# Patient Record
Sex: Female | Born: 1980 | Race: White | Hispanic: No | Marital: Married | Smoking: Former smoker
Health system: Southern US, Community
[De-identification: ages and names within clinical notes are randomized; demographics above are authoritative.]

## PROBLEM LIST (undated history)

## (undated) DIAGNOSIS — Q950 Balanced translocation and insertion in normal individual: Secondary | ICD-10-CM

## (undated) DIAGNOSIS — J45909 Unspecified asthma, uncomplicated: Secondary | ICD-10-CM

## (undated) HISTORY — PX: WISDOM TOOTH EXTRACTION: SHX21

## (undated) HISTORY — DX: Balanced translocation and insertion in normal individual: Q95.0

## (undated) HISTORY — DX: Unspecified asthma, uncomplicated: J45.909

---

## 2014-07-07 HISTORY — PX: DILATION AND CURETTAGE OF UTERUS: SHX78

## 2014-09-13 ENCOUNTER — Ambulatory Visit: Payer: BLUE CROSS/BLUE SHIELD | Admitting: General Practice

## 2014-09-13 DIAGNOSIS — Z3490 Encounter for supervision of normal pregnancy, unspecified, unspecified trimester: Secondary | ICD-10-CM

## 2014-09-13 LAB — POCT PREGNANCY, URINE: PREG TEST UR: POSITIVE — AB

## 2014-09-13 NOTE — Progress Notes (Signed)
Patient here for pregnancy test, UPT positive. Reports first positive home test on 2/24. LMP 08/09/14 giving EDD 05/16/15. Would like to start prenatal care. No ultrasound needed at this time given certain LMP. Will draw new OB labs today. Patient to return for new OB visit/1 hr gtt

## 2014-09-14 LAB — PRENATAL PROFILE (SOLSTAS)
Antibody Screen: NEGATIVE
Basophils Absolute: 0.1 10*3/uL (ref 0.0–0.1)
Basophils Relative: 3 % — ABNORMAL HIGH (ref 0–1)
Eosinophils Absolute: 0.2 10*3/uL (ref 0.0–0.7)
Eosinophils Relative: 7 % — ABNORMAL HIGH (ref 0–5)
HEMATOCRIT: 40.6 % (ref 36.0–46.0)
HEMOGLOBIN: 13.9 g/dL (ref 12.0–15.0)
HEP B S AG: NEGATIVE
HIV: NONREACTIVE
LYMPHS PCT: 63 % — AB (ref 12–46)
Lymphs Abs: 1.4 10*3/uL (ref 0.7–4.0)
MCH: 32.9 pg (ref 26.0–34.0)
MCHC: 34.2 g/dL (ref 30.0–36.0)
MCV: 96 fL (ref 78.0–100.0)
MONOS PCT: 20 % — AB (ref 3–12)
MPV: 9.9 fL (ref 8.6–12.4)
Monocytes Absolute: 0.5 10*3/uL (ref 0.1–1.0)
Neutro Abs: 0.2 10*3/uL — ABNORMAL LOW (ref 1.7–7.7)
Neutrophils Relative %: 7 % — ABNORMAL LOW (ref 43–77)
PLATELETS: 241 10*3/uL (ref 150–400)
RBC: 4.23 MIL/uL (ref 3.87–5.11)
RDW: 13.5 % (ref 11.5–15.5)
Rh Type: POSITIVE
Rubella: 7.15 Index — ABNORMAL HIGH (ref <0.90)
WBC: 2.3 10*3/uL — ABNORMAL LOW (ref 4.0–10.5)

## 2014-09-14 LAB — CYSTIC FIBROSIS DIAGNOSTIC STUDY

## 2014-09-14 LAB — CULTURE, OB URINE
COLONY COUNT: NO GROWTH
Organism ID, Bacteria: NO GROWTH

## 2014-09-15 LAB — PRESCRIPTION MONITORING PROFILE (19 PANEL)
AMPHETAMINE/METH: NEGATIVE ng/mL
BENZODIAZEPINE SCREEN, URINE: NEGATIVE ng/mL
Barbiturate Screen, Urine: NEGATIVE ng/mL
Buprenorphine, Urine: NEGATIVE ng/mL
Cannabinoid Scrn, Ur: NEGATIVE ng/mL
Carisoprodol, Urine: NEGATIVE ng/mL
Cocaine Metabolites: NEGATIVE ng/mL
Creatinine, Urine: 161.95 mg/dL (ref >20.0)
FENTANYL URINE: NEGATIVE ng/mL
MDMA URINE: NEGATIVE ng/mL
METHADONE SCREEN, URINE: NEGATIVE ng/mL
Meperidine, Ur: NEGATIVE ng/mL
Methaqualone: NEGATIVE ng/mL
NITRITES URINE, INITIAL: NEGATIVE ug/mL
Opiate Screen, Urine: NEGATIVE ng/mL
Oxycodone Screen, Ur: NEGATIVE ng/mL
PH URINE, INITIAL: 7 pH (ref 4.5–8.9)
PHENCYCLIDINE, UR: NEGATIVE ng/mL
Propoxyphene: NEGATIVE ng/mL
TAPENTADOLUR: NEGATIVE ng/mL
TRAMADOL UR: NEGATIVE ng/mL
Zolpidem, Urine: NEGATIVE ng/mL

## 2014-10-17 ENCOUNTER — Ambulatory Visit (HOSPITAL_COMMUNITY)
Admission: RE | Admit: 2014-10-17 | Discharge: 2014-10-17 | Disposition: A | Discharge status: Home / Self Care | Payer: BLUE CROSS/BLUE SHIELD | Source: Ambulatory Visit | Attending: Obstetrics and Gynecology | Admitting: Obstetrics and Gynecology

## 2014-10-17 ENCOUNTER — Encounter: Payer: Self-pay | Admitting: Obstetrics and Gynecology

## 2014-10-17 ENCOUNTER — Ambulatory Visit (INDEPENDENT_AMBULATORY_CARE_PROVIDER_SITE_OTHER): Payer: BLUE CROSS/BLUE SHIELD | Admitting: Obstetrics and Gynecology

## 2014-10-17 DIAGNOSIS — Z3A08 8 weeks gestation of pregnancy: Secondary | ICD-10-CM | POA: Diagnosis not present

## 2014-10-17 DIAGNOSIS — O3680X1 Pregnancy with inconclusive fetal viability, fetus 1: Secondary | ICD-10-CM

## 2014-10-17 DIAGNOSIS — Z3491 Encounter for supervision of normal pregnancy, unspecified, first trimester: Secondary | ICD-10-CM

## 2014-10-17 DIAGNOSIS — Z36 Encounter for antenatal screening of mother: Secondary | ICD-10-CM | POA: Insufficient documentation

## 2014-10-17 DIAGNOSIS — Z3481 Encounter for supervision of other normal pregnancy, first trimester: Secondary | ICD-10-CM

## 2014-10-17 DIAGNOSIS — O3481 Maternal care for other abnormalities of pelvic organs, first trimester: Secondary | ICD-10-CM | POA: Diagnosis not present

## 2014-10-17 DIAGNOSIS — O209 Hemorrhage in early pregnancy, unspecified: Secondary | ICD-10-CM | POA: Diagnosis not present

## 2014-10-17 DIAGNOSIS — O021 Missed abortion: Secondary | ICD-10-CM | POA: Diagnosis not present

## 2014-10-17 DIAGNOSIS — N831 Corpus luteum cyst: Secondary | ICD-10-CM | POA: Insufficient documentation

## 2014-10-17 LAB — POCT URINALYSIS DIP (DEVICE)
BILIRUBIN URINE: NEGATIVE
Glucose, UA: NEGATIVE mg/dL
Hgb urine dipstick: NEGATIVE
KETONES UR: NEGATIVE mg/dL
LEUKOCYTES UA: NEGATIVE
Nitrite: NEGATIVE
PH: 7 (ref 5.0–8.0)
Protein, ur: NEGATIVE mg/dL
SPECIFIC GRAVITY, URINE: 1.02 (ref 1.005–1.030)
Urobilinogen, UA: 1 mg/dL (ref 0.0–1.0)

## 2014-10-17 NOTE — Progress Notes (Signed)
Bedside US for viability - single IUP, unable to confirm FHR with abdominal scan.  Pt taken to [place] dept for transvag exam and will return to discuss results.

## 2014-10-17 NOTE — Patient Instructions (Addendum)
Incomplete Miscarriage A miscarriage is the sudden loss of an unborn baby (fetus) before the 20th week of pregnancy. In an incomplete miscarriage, parts of the fetus or placenta (afterbirth) remain in the body.  Having a miscarriage can be an emotional experience. Talk with your health care provider about any questions you may have about miscarrying, the grieving process, and your future pregnancy plans. CAUSES   Problems with the fetal chromosomes that make it impossible for the baby to develop normally. Problems with the baby's genes or chromosomes are most often the result of errors that occur by chance as the embryo divides and grows. The problems are not inherited from the parents.  Infection of the cervix or uterus.  Hormone problems.  Problems with the cervix, such as having an incompetent cervix. This is when the tissue in the cervix is not strong enough to hold the pregnancy.  Problems with the uterus, such as an abnormally shaped uterus, uterine fibroids, or congenital abnormalities.  Certain medical conditions.  Smoking, drinking alcohol, or taking illegal drugs.  Trauma. SYMPTOMS   Vaginal bleeding or spotting, with or without cramps or pain.  Pain or cramping in the abdomen or lower back.  Passing fluid, tissue, or blood clots from the vagina. DIAGNOSIS  Your health care provider will perform a physical exam. You may also have an ultrasound to confirm the miscarriage. Blood or urine tests may also be ordered. TREATMENT   Usually, a dilation and curettage (D&C) procedure is performed. During a D&C procedure, the cervix is widened (dilated) and any remaining fetal or placental tissue is gently removed from the uterus.  Antibiotic medicines are prescribed if there is an infection. Other medicines may be given to reduce the size of the uterus (contract) if there is a lot of bleeding.  If you have Rh negative blood and your baby was Rh positive, you will need a Rho (D)  immune globulin shot. This shot will protect any future baby from having Rh blood problems in future pregnancies.  You may be confined to bed rest. This means you should stay in bed and only get up to use the bathroom. HOME CARE INSTRUCTIONS   Rest as directed by your health care provider.  Restrict activity as directed by your health care provider. You may be allowed to continue light activity if curettage was not done but you require further treatment.  Keep track of the number of pads you use each day. Keep track of how soaked (saturated) they are. Record this information.  Do not  use tampons.  Do not douche or have sexual intercourse until approved by your health care provider.  Keep all follow-up appointments for reevaluation and continuing management.  Only take over-the-counter or prescription medicines for pain, fever, or discomfort as directed by your health care provider.  Take antibiotic medicine as directed by your health care provider. Make sure you finish it even if you start to feel better. SEEK IMMEDIATE MEDICAL CARE IF:   You experience severe cramps in your stomach, back, or abdomen.  You have an unexplained temperature (make sure to record these temperatures).  You pass large clots or tissue (save these for your health care provider to inspect).  Your bleeding increases.  You become light-headed, weak, or have fainting episodes. MAKE SURE YOU:   Understand these instructions.  Will watch your condition.  Will get help right away if you are not doing well or get worse. Document Released: 06/23/2005 Document Revised: 11/07/2013 Document Reviewed:   01/20/2013 Exit[employer] Patient Information 2015 [employer]. This information is not intended to replace advice given to you by your health care provider. Make sure you discuss any questions you have with your health care provider.  

## 2014-10-17 NOTE — Progress Notes (Signed)
SUBJECTIVE: 34 yo G1 at [redacted]w[redacted]d menstrual age presented for NOB. Denies any cramping or bleeding.  OBJECTIVE: There were no vitals filed for this visit.  US Ob Comp Less 14 Wks  10/17/2014   CLINICAL DATA:  Examination to determine fetal viability.  EXAM: OBSTETRIC <14 WK [place] AND TRANSVAGINAL OB US  TECHNIQUE: Both transabdominal and transvaginal ultrasound examinations were performed for complete evaluation of the gestation as well as the maternal uterus, adnexal regions, and pelvic cul-de-sac. Transvaginal technique was performed to assess early pregnancy.  COMPARISON:  None.  FINDINGS: Intrauterine gestational sac: Single  Yolk sac:  No  Embryo:  Yes  Cardiac Activity: No  CRL:  20  mm   8 w   4 d  Maternal uterus/adnexae: There is a moderate subchorionic hemorrhage. The ovaries are normal with a corpus luteum cyst on the left. No free fluid.  IMPRESSION: Intrauterine fetal demise.  Subchorionic hemorrhage.   Electronically Signed   By: Lorriane Shire M.D.   On: 10/17/2014 11:02   US Ob Transvaginal  10/17/2014   CLINICAL DATA:  Examination to determine fetal viability.  EXAM: OBSTETRIC <14 WK [place] AND TRANSVAGINAL OB US  TECHNIQUE: Both transabdominal and transvaginal ultrasound examinations were performed for complete evaluation of the gestation as well as the maternal uterus, adnexal regions, and pelvic cul-de-sac. Transvaginal technique was performed to assess early pregnancy.  COMPARISON:  None.  FINDINGS: Intrauterine gestational sac: Single  Yolk sac:  No  Embryo:  Yes  Cardiac Activity: No  CRL:  20  mm   8 w   4 d  Maternal uterus/adnexae: There is a moderate subchorionic hemorrhage. The ovaries are normal with a corpus luteum cyst on the left. No free fluid.  IMPRESSION: Intrauterine fetal demise.  Subchorionic hemorrhage.   Electronically Signed   By: Lorriane Shire M.D.   On: 10/17/2014 11:02  ASSESSMENT: Fetal demise. [facility] Grief reaction PLAN: Discussed results with pt. Discussed options of  expectant management, cytotec or D&E. She elects to leave, discuss with partner and make decision tomorrow. Bleeding precautions given.  Note for [facility] staff to call her tomorrow.

## 2014-10-19 ENCOUNTER — Telehealth: Payer: Self-pay | Admitting: *Deleted

## 2014-10-19 NOTE — Telephone Encounter (Signed)
Called patient and left message to call us back.

## 2014-10-19 NOTE — Telephone Encounter (Signed)
-----   Message from Lorene Dy, CNM sent at 10/17/2014 12:10 PM EDT ----- Fetal demise, undecided on option. Please call tomorrow and call in per cytotec protocol if that is what she elects to do

## 2014-10-23 ENCOUNTER — Encounter (HOSPITAL_COMMUNITY): Payer: Self-pay | Admitting: General Practice

## 2014-10-24 ENCOUNTER — Encounter (HOSPITAL_COMMUNITY): Payer: Self-pay | Admitting: Anesthesiology

## 2014-10-24 ENCOUNTER — Ambulatory Visit (HOSPITAL_COMMUNITY): Payer: BLUE CROSS/BLUE SHIELD | Admitting: Anesthesiology

## 2014-10-24 ENCOUNTER — Ambulatory Visit (HOSPITAL_COMMUNITY)
Admission: RE | Admit: 2014-10-24 | Discharge: 2014-10-24 | Disposition: A | Discharge status: Home / Self Care | Payer: BLUE CROSS/BLUE SHIELD | Source: Ambulatory Visit | Attending: Obstetrics and Gynecology | Admitting: Obstetrics and Gynecology

## 2014-10-24 ENCOUNTER — Encounter (HOSPITAL_COMMUNITY)
Admission: RE | Disposition: A | Discharge status: Home / Self Care | Payer: Self-pay | Source: Ambulatory Visit | Attending: Obstetrics and Gynecology

## 2014-10-24 DIAGNOSIS — O021 Missed abortion: Secondary | ICD-10-CM | POA: Diagnosis not present

## 2014-10-24 DIAGNOSIS — F909 Attention-deficit hyperactivity disorder, unspecified type: Secondary | ICD-10-CM | POA: Insufficient documentation

## 2014-10-24 DIAGNOSIS — Z3A08 8 weeks gestation of pregnancy: Secondary | ICD-10-CM | POA: Diagnosis not present

## 2014-10-24 HISTORY — PX: DILATION AND EVACUATION: SHX1459

## 2014-10-24 LAB — CBC
HCT: 38.2 % (ref 36.0–46.0)
Hemoglobin: 14.4 g/dL (ref 12.0–15.0)
MCH: 34 pg (ref 26.0–34.0)
MCHC: 37.7 g/dL — ABNORMAL HIGH (ref 30.0–36.0)
MCV: 90.1 fL (ref 78.0–100.0)
PLATELETS: 231 10*3/uL (ref 150–400)
RBC: 4.24 MIL/uL (ref 3.87–5.11)
RDW: 12.3 % (ref 11.5–15.5)
WBC: 3.2 10*3/uL — AB (ref 4.0–10.5)

## 2014-10-24 SURGERY — DILATION AND EVACUATION, UTERUS
Anesthesia: Monitor Anesthesia Care | Site: Vagina

## 2014-10-24 MED ORDER — ONDANSETRON HCL 4 MG/2ML IJ SOLN
INTRAMUSCULAR | Status: AC
  Filled 2014-10-24: qty 2

## 2014-10-24 MED ORDER — LIDOCAINE HCL (CARDIAC) 20 MG/ML IV SOLN
INTRAVENOUS | Status: DC | PRN
  Administered 2014-10-24: 60 mg via INTRAVENOUS

## 2014-10-24 MED ORDER — DEXTROSE 5 % IV SOLN
2.0000 g | INTRAVENOUS | Status: AC
  Administered 2014-10-24: 2 g via INTRAVENOUS
  Filled 2014-10-24 (×2): qty 2

## 2014-10-24 MED ORDER — METHYLERGONOVINE MALEATE 0.2 MG/ML IJ SOLN
INTRAMUSCULAR | Status: DC | PRN
  Administered 2014-10-24: 0.2 mg via INTRAMUSCULAR

## 2014-10-24 MED ORDER — MIDAZOLAM HCL 2 MG/2ML IJ SOLN
INTRAMUSCULAR | Status: AC
  Filled 2014-10-24: qty 2

## 2014-10-24 MED ORDER — KETOROLAC TROMETHAMINE 30 MG/ML IJ SOLN
INTRAMUSCULAR | Status: AC
  Filled 2014-10-24: qty 1

## 2014-10-24 MED ORDER — LIDOCAINE HCL (CARDIAC) 20 MG/ML IV SOLN
INTRAVENOUS | Status: AC
  Filled 2014-10-24: qty 5

## 2014-10-24 MED ORDER — SCOPOLAMINE 1 MG/3DAYS TD PT72
MEDICATED_PATCH | TRANSDERMAL | Status: AC
  Administered 2014-10-24: 1.5 mg via TRANSDERMAL
  Filled 2014-10-24: qty 1

## 2014-10-24 MED ORDER — DEXAMETHASONE SODIUM PHOSPHATE 10 MG/ML IJ SOLN
INTRAMUSCULAR | Status: DC | PRN
  Administered 2014-10-24: 4 mg via INTRAVENOUS

## 2014-10-24 MED ORDER — OXYCODONE HCL 5 MG/5ML PO SOLN: 5.0000 mg | Freq: Once | ORAL | Status: DC | PRN

## 2014-10-24 MED ORDER — PROPOFOL 10 MG/ML IV BOLUS
INTRAVENOUS | Status: AC
  Filled 2014-10-24: qty 20

## 2014-10-24 MED ORDER — MIDAZOLAM HCL 2 MG/2ML IJ SOLN
INTRAMUSCULAR | Status: DC | PRN
  Administered 2014-10-24: 2 mg via INTRAVENOUS

## 2014-10-24 MED ORDER — LIDOCAINE-EPINEPHRINE 1 %-1:100000 IJ SOLN
INTRAMUSCULAR | Status: DC | PRN
  Administered 2014-10-24: 20 mL

## 2014-10-24 MED ORDER — PROPOFOL 500 MG/50ML IV EMUL
INTRAVENOUS | Status: DC | PRN
Start: 2014-10-24 — End: 2014-10-24
  Administered 2014-10-24 (×5): 20 mg via INTRAVENOUS

## 2014-10-24 MED ORDER — LIDOCAINE-EPINEPHRINE 1 %-1:100000 IJ SOLN
INTRAMUSCULAR | Status: AC
Start: 2014-10-24 — End: 2014-10-24
  Filled 2014-10-24: qty 1

## 2014-10-24 MED ORDER — METOCLOPRAMIDE HCL 5 MG/ML IJ SOLN: 10.0000 mg | Freq: Once | INTRAMUSCULAR | Status: DC | PRN

## 2014-10-24 MED ORDER — FENTANYL CITRATE (PF) 100 MCG/2ML IJ SOLN
INTRAMUSCULAR | Status: DC | PRN
  Administered 2014-10-24 (×4): 25 ug via INTRAVENOUS

## 2014-10-24 MED ORDER — PROPOFOL INFUSION 10 MG/ML OPTIME
INTRAVENOUS | Status: DC | PRN
  Administered 2014-10-24: 75 ug/kg/min via INTRAVENOUS

## 2014-10-24 MED ORDER — FENTANYL CITRATE (PF) 100 MCG/2ML IJ SOLN
INTRAMUSCULAR | Status: AC
  Filled 2014-10-24: qty 2

## 2014-10-24 MED ORDER — KETOROLAC TROMETHAMINE 30 MG/ML IJ SOLN
INTRAMUSCULAR | Status: DC | PRN
  Administered 2014-10-24: 30 mg via INTRAVENOUS

## 2014-10-24 MED ORDER — FENTANYL CITRATE (PF) 100 MCG/2ML IJ SOLN: 25.0000 ug | INTRAMUSCULAR | Status: DC | PRN

## 2014-10-24 MED ORDER — OXYCODONE HCL 5 MG PO TABS: 5.0000 mg | ORAL_TABLET | Freq: Once | ORAL | Status: DC | PRN

## 2014-10-24 MED ORDER — DEXAMETHASONE SODIUM PHOSPHATE 4 MG/ML IJ SOLN
INTRAMUSCULAR | Status: AC
  Filled 2014-10-24: qty 1

## 2014-10-24 MED ORDER — LACTATED RINGERS IV SOLN
INTRAVENOUS | Status: DC
  Administered 2014-10-24: 13:00:00 via INTRAVENOUS
  Administered 2014-10-24: 10 mL/h via INTRAVENOUS

## 2014-10-24 MED ORDER — ONDANSETRON HCL 4 MG/2ML IJ SOLN
INTRAMUSCULAR | Status: DC | PRN
  Administered 2014-10-24: 4 mg via INTRAVENOUS

## 2014-10-24 MED ORDER — SCOPOLAMINE 1 MG/3DAYS TD PT72
1.0000 | MEDICATED_PATCH | Freq: Once | TRANSDERMAL | Status: DC
  Administered 2014-10-24: 1.5 mg via TRANSDERMAL

## 2014-10-24 MED ORDER — MEPERIDINE HCL 25 MG/ML IJ SOLN: 6.2500 mg | INTRAMUSCULAR | Status: DC | PRN

## 2014-10-24 SURGICAL SUPPLY — 14 items
CATH ROBINSON RED A/P 16FR (CATHETERS) ×2 IMPLANT
CLOTH BEACON ORANGE TIMEOUT ST (SAFETY) ×2 IMPLANT
DECANTER SPIKE VIAL GLASS SM (MISCELLANEOUS) ×2 IMPLANT
GLOVE BIO SURGEON STRL SZ8 (GLOVE) ×2 IMPLANT
GLOVE SURG ORTHO 8.0 STRL STRW (GLOVE) ×2 IMPLANT
GOWN STRL REUS W/TWL LRG LVL3 (GOWN DISPOSABLE) ×4 IMPLANT
KIT BERKELEY 1ST TRIMESTER 3/8 (MISCELLANEOUS) ×2 IMPLANT
NS IRRIG 1000ML POUR BTL (IV SOLUTION) ×2 IMPLANT
PACK VAGINAL MINOR WOMEN LF (CUSTOM PROCEDURE TRAY) ×2 IMPLANT
PAD OB MATERNITY 4.3X12.25 (PERSONAL CARE ITEMS) ×2 IMPLANT
PAD PREP 24X48 CUFFED NSTRL (MISCELLANEOUS) ×2 IMPLANT
SET BERKELEY SUCTION TUBING (SUCTIONS) ×2 IMPLANT
TOWEL OR 17X24 6PK STRL BLUE (TOWEL DISPOSABLE) ×4 IMPLANT
VACURETTE 8 RIGID CVD (CANNULA) ×2 IMPLANT

## 2014-10-24 NOTE — Transfer of Care (Signed)
Immediate Anesthesia Transfer of Care Note  Patient: Erika [name]  Procedure(s) Performed: Procedure(s): DILATATION AND EVACUATION with genetic studies (N/A)  Patient Location: PACU  Anesthesia Type:MAC  Level of Consciousness: awake, alert , oriented and patient cooperative  Airway & Oxygen Therapy: Patient Spontanous Breathing and Patient connected to nasal cannula oxygen  Post-op Assessment: Report given to RN and Post -op Vital signs reviewed and stable  Post vital signs: Reviewed and stable  Last Vitals:  Filed Vitals:   10/24/14 1404  BP:   Pulse:   Temp: 36.9 C  Resp:     Complications: No apparent anesthesia complications

## 2014-10-24 NOTE — Anesthesia Preprocedure Evaluation (Signed)
Anesthesia Evaluation  Patient identified by MRN, date of birth, ID band Patient awake    Reviewed: Allergy & Precautions, NPO status , Patient's Chart, lab work & pertinent test results  Airway Mallampati: I  TM Distance: >3 FB Neck ROM: Full    Dental no notable dental hx. (+) Teeth Intact   Pulmonary neg pulmonary ROS,  breath sounds clear to auscultation  Pulmonary exam normal       Cardiovascular negative cardio ROS  Rhythm:Regular Rate:Normal     Neuro/Psych ADHDnegative neurological ROS     GI/Hepatic negative GI ROS, Neg liver ROS,   Endo/Other  negative endocrine ROS  Renal/GU negative Renal ROS  negative genitourinary   Musculoskeletal negative musculoskeletal ROS (+)   Abdominal Normal abdominal exam  (+)   Peds  Hematology negative hematology ROS (+)   Anesthesia Other Findings   Reproductive/Obstetrics (+) Pregnancy Missed Ab                             Anesthesia Physical Anesthesia Plan  ASA: II  Anesthesia Plan: MAC   Post-op Pain Management:    Induction: Intravenous  Airway Management Planned: Simple Face Mask  Additional Equipment:   Intra-op Plan:   Post-operative Plan:   Informed Consent: I have reviewed the patients History and Physical, chart, labs and discussed the procedure including the risks, benefits and alternatives for the proposed anesthesia with the patient or authorized representative who has indicated his/her understanding and acceptance.   Dental advisory given  Plan Discussed with: CRNA, Anesthesiologist and Surgeon  Anesthesia Plan Comments:         Anesthesia Quick Evaluation

## 2014-10-24 NOTE — Brief Op Note (Signed)
10/24/2014  1:56 PM  PATIENT:  Erika [name]  34 y.o. female  PRE-OPERATIVE DIAGNOSIS:  first trimester embryonic demise  POST-OPERATIVE DIAGNOSIS:  first trimester embryonic demise  PROCEDURE:  Procedure(s): DILATATION AND EVACUATION with genetic studies (N/A)  SURGEON:  Surgeon(s) and Role:    * Louretta Shorten, MD - Primary  PHYSICIAN ASSISTANT:   ASSISTANTS: none   ANESTHESIA:   local and MAC  EBL:  Total I/O In: 800 [I.V.:800] Out: 75 [Urine:50; Blood:25]  BLOOD ADMINISTERED:none  DRAINS: none   LOCAL MEDICATIONS USED:  MARCAINE     SPECIMEN:  Source of Specimen:  POC  DISPOSITION OF SPECIMEN:  PATHOLOGY  COUNTS:  YES  TOURNIQUET:  * No tourniquets in log *  DICTATION: .Other Dictation: Dictation Number 1  PLAN OF CARE: Discharge to home after PACU  PATIENT DISPOSITION:  PACU - hemodynamically stable.   Delay start of Pharmacological VTE agent (>24hrs) due to surgical blood loss or risk of bleeding: not applicable

## 2014-10-24 NOTE — Anesthesia Postprocedure Evaluation (Signed)
Anesthesia Post Note  Patient: Erika [name]  Procedure(s) Performed: Procedure(s) (LRB): DILATATION AND EVACUATION with genetic studies (N/A)  Anesthesia type: General  Patient location: PACU  Post pain: Pain level controlled  Post assessment: Post-op Vital signs reviewed  Last Vitals:  Filed Vitals:   10/24/14 1500  BP: 110/71  Pulse: 94  Temp: 36.9 C  Resp: 20    Post vital signs: Reviewed  Level of consciousness: sedated  Complications: No apparent anesthesia complications

## 2014-10-24 NOTE — H&P (Signed)
  Erika [name] is a 34 yo G2 P0 with a 8 2/7 embryonic demise presenting for D&E  Seen in [facility] on 4/12 with [place]  Confirming the above Prenatal labs normal A+ blood type  Exam c/w [place] 8-9 weeks CV RRR Lungs CTAB  History of 2 previous SABS. Pt desires genetic eval of POC  PMX neg PSHX neg  Embryonic demise with 8.5 weeks size R&B of D&E discussed and informend consent obtained. H&P is current DL

## 2014-10-24 NOTE — Discharge Instructions (Signed)

## 2014-10-25 NOTE — Telephone Encounter (Addendum)
Per chart review, pt had D&E yesterday by Dr. Louretta Shorten. No additional follow up indicated from this office regarding this demise. Message sent to [facility] for information purposes.

## 2014-10-25 NOTE — Op Note (Signed)
[name], [name] NO.:  [id number]  MEDICAL RECORD NO.:  [id number]  LOCATION:  [facility]                          FACILITY:  [facility]  PHYSICIAN:  Monia Sabal. Corinna Capra, M.D.    DATE OF BIRTH:  June 19, 1981  DATE OF PROCEDURE:  10/24/2014 DATE OF DISCHARGE:  10/24/2014                              OPERATIVE REPORT   PREOPERATIVE DIAGNOSIS:  Embryonic demise at 53-1/2 weeks' gestational age.  POSTOPERATIVE DIAGNOSIS:  Embryonic demise at 4-1/2 weeks' gestational age.  PROCEDURE:  Dilation and evacuation.  SURGEON:  Monia Sabal. Corinna Capra, M.D.  ANESTHESIA:  Monitored anesthetic care and paracervical block.  INDICATIONS:  Ms. Christenson is a 34 year old, G2, P2, possibly G3 P3 with an 8-1/2 week embryonic demise based on ultrasound.  She desires to have a dilation and evacuation with products of conception for genetic evaluation.  Risks and benefits of procedure were discussed at length. Informed consent was obtained.  DESCRIPTION OF PROCEDURE:  After adequate analgesia, the patient was placed in the dorsal lithotomy position.  She was sterilely prepped and draped.  Bladder sterilely drained.  Graves speculum was placed. Tenaculum was placed at the anterior lip of the cervix.  Paracervical block was placed, 1% Xylocaine, 1:100,000 epinephrine, total 20 mL used. Uterus sounded to 9 cm, easily dilated to a #27 Pratt dilator.  An 8 mm suction curette was inserted.  Suction curettage was performed, retrieving products of conception.  Curettage was performed until a gritty surface felt throughout the endometrial cavity with no residual products noted.  The patient was then given Methergine 0.2 mg IM with good uterine response and minimal bleeding at this time noted.  Curette was then removed.  The tenaculum was removed from the anterior lip of the cervix, noted to be hemostatic.  The patient was then transferred to recovery room in stable condition.  Sponge and instrument count was normal  x3.  Estimated blood loss was minimal.  The patient received 2 g of cefotetan preoperatively, 0.2 mg of Methergine intraoperatively, and 30 mg of Toradol IV intraoperatively.  DISPOSITION:  The patient will be discharged home with routine instruction sheet for a dilation and evacuation.  Told to return for increased pain, fever, or bleeding.  She will follow up in the office in 2 to 3 weeks for a postop visit.    Monia Sabal Corinna Capra, M.D.    DCL/MEDQ  D:  10/24/2014  T:  10/25/2014  Job:  [id number]

## 2014-10-26 ENCOUNTER — Encounter (HOSPITAL_COMMUNITY): Payer: Self-pay | Admitting: Obstetrics and Gynecology

## 2014-11-03 ENCOUNTER — Encounter: Payer: Self-pay | Admitting: Obstetrics & Gynecology

## 2014-11-03 ENCOUNTER — Ambulatory Visit: Payer: BLUE CROSS/BLUE SHIELD | Admitting: Obstetrics & Gynecology

## 2014-11-03 VITALS — BP 133/81 | HR 100 | Ht 65.0 in | Wt 146.2 lb

## 2014-11-03 NOTE — Progress Notes (Signed)
Patient had D&E by Dr. Corinna Capra, will follow up with him as scheduled on 11/07/14 as scheduled.  No need for follow up here in [facility].   Verita Schneiders, MD, [place] Attending [place] for [employer], [place]

## 2014-11-06 LAB — CHROMOSOME STD, POC(TISSUE)-NCBH

## 2014-11-06 LAB — TISSUE HYBRIDIZATION TO NCBH

## 2014-12-21 ENCOUNTER — Ambulatory Visit (HOSPITAL_COMMUNITY)
Admission: RE | Admit: 2014-12-21 | Discharge: 2014-12-21 | Disposition: A | Discharge status: Home / Self Care | Payer: BLUE CROSS/BLUE SHIELD | Source: Ambulatory Visit | Attending: Obstetrics and Gynecology | Admitting: Obstetrics and Gynecology

## 2014-12-21 DIAGNOSIS — Q950 Balanced translocation and insertion in normal individual: Secondary | ICD-10-CM

## 2014-12-21 DIAGNOSIS — IMO0002 Reserved for concepts with insufficient information to code with codable children: Secondary | ICD-10-CM

## 2014-12-22 ENCOUNTER — Encounter (HOSPITAL_COMMUNITY): Payer: Self-pay

## 2014-12-22 DIAGNOSIS — Q950 Balanced translocation and insertion in normal individual: Secondary | ICD-10-CM | POA: Insufficient documentation

## 2014-12-22 NOTE — Progress Notes (Addendum)
Genetic Counseling  Preconception Note  Appointment Date:  12/21/14 Referred By: Louretta Shorten, MD Date of Birth:  05/09/1981 Partner: Erika [name]   Pregnancy History: [id number]    I met with [name] and her husband, Mr. 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  We began by reviewing the family history in detail. The couple reported a history of three early first trimester pregnancy losses. The most recent miscarriage occurred in April of this year at approximately 8 and a half weeks gestation. They reported that the previous two miscarriages happened at earlier gestations. 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 Thus, Erika [name] had peripheral blood chromosome analysis facilitated through her OB/GYN, which confirmed the presence of a balanced insertional translocation. 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  This means that an interstitial segment from the long arm ("q" arm) of chromosome 10 has been inserted into the short arm ("p" arm) of chromosome 11.   Erika [name] reported that no additional relatives have had chromosome analysis. She reported that her parents had a history of 2-3 miscarriages and a son born preterm, who died hours after birth due to prematurity prior to having the patient's sister and then the patient. The patient reported that her sister had leukemia at age 46 years old and is currently healthy at age 86 years. Her sister has a history of one miscarriage, a son with  type I diabetes mellitus, and a healthy daughter.   We reviewed that reciprocal balanced translocations occur in approximately 1 in every 500 individuals and involve two chromosomes, with one breakpoint in each, resulting in a two-way exchange of chromosome material.  The majority of individuals with a balanced reciprocal translocation are healthy and do not have any symptoms related to the translocation, as is the case for Erika [name].  We reviewed chromosomes and meiotic segregation patterns related to this couple's reproductive risks (for both the current and future pregnancies).  Theoretically, the chance that Erika [name] would pass on her typical chromosomes 10 and 11 is ~25%; the chance that she would pass on a balanced form of the translocation is ~25%; and finally the chance that she would pass on an unbalanced form of the translocation is ~50%.  We discussed that pregnancies which inherit an unbalanced form of the translocation could result in a miscarriage or live born offspring with increased risk for medical concerns.  Children with an unbalanced form of the translocation would have significant increased risk for physical and/or neurodevelopmental differences.  They were counseled that while the theoretical risk estimates discussed above establish a framework for providing reproductive risks for balanced reciprocal translocation carriers in general, other factors play a role in determining specific reproductive risks.  We discussed that we would provide information regarding the specific chromosome translocation and family history to [facility] for more specific information regarding the reproductive risks associated with Erika [name]'s translocation.  Dr. Gillermina [name], cytogeneticist, offers a service in which she provides a specific risk assessment for miscarriage and abnormal live births (offspring) based on reported outcome data from other families with the same or similar  translocations, as well  as personal information including Erika [name]'s age, pregnancy history, and family history.  We will contact the patient once this information is obtained.   We discussed available reproductive options with the couple. In the case of spontaneous conception, we discussed that the following screening options would be available in a pregnancy: nuchal translucency ultrasound, second trimester targeted ultrasound, and noninvasive prenatal screening (NIPS). These screening options could assess for features of an unbalanced form of the translocation. They would not be diagnostic for chromosome conditions and also cannot determine formation of the chromosomes. Specifically, we discussed that one laboratory performing NIPS offers a platform called MaterniTGenome which assesses for material from all chromosomes. It reportedly has a detection rate of approximately 95% for chromosome imbalances. We discussed that diagnostic testing options for karyotype analysis through chorionic villus sampling (10-[redacted] weeks gestation) and amniocentesis (after [redacted] weeks gestation), which can assess for balanced or unbalanced forms of the translocation. We reviewed the risks, benefits, and limitations of these testing options including the associated risks for complications.   We also discussed the option of preimplantation genetic diagnosis (PGD)/preimplantation genetic screening (PGS). This process involves embryo(s) obtained through in vitro fertilization that would then have chromosome testing prior to the implantation of unaffected embryo(s), if present, into the uterus. The couple expressed interest in obtaining more detailed information regarding this option. We provided them with contact information for reproductive endocrinology and infertility specialists including [facility] and Erika [name] with [facility]. We also discussed options of ovum donation or adoption with  the couple. The couple indicated that they were most interested in the option of PGD/PGS and plan to pursue obtaining additional information regarding how to proceed with this option.    We also discussed available support resources and provided written information from an online support group, [employer]. They were encouraged to call if additional support resources would be helpful for them.   The remainder of both family histories were reviewed and found to be contributory for intellectual disability for Mr. Stimson's female paternal half-first cousin (his father's paternal half-brother's son). Mr. Theriault reported that this relative's mother reportedly drank alcohol and smoked during the pregnancy. This cousin was not described to have physical medical issues. They were counseled that there are many different causes of intellectual disabilities including environmental, multifactorial, and genetic etiologies.  We discussed that a specific diagnosis for intellectual disability can be determined in approximately 50% of these individuals.  In the remaining 50% of individuals, a diagnosis may never be determined.  Regarding genetic causes, we discussed that chromosome aberrations (aneuploidy, deletions, duplications, insertions, and translocations) are responsible for a small percentage of individuals with intellectual disability.  Many individuals with chromosome aberrations have additional differences, including congenital anomalies or minor dysmorphisms.  Likewise, single gene conditions are the underlying cause of intellectual delay in some families.  We discussed that many gene conditions have intellectual disability as a feature, but also often include other physical or medical differences.  We reviewed that prenatal alcohol exposure is a known risk factor for intellectual disability. In the case of a teratogenic exposure in pregnancy as the cause for this individual's delays, recurrence risk would not be increased  for other relatives. In the case of an underlying hereditary component, recurrence risk estimate may change. However, given the reported family history and degree of relation, this family history would not likely be associated with an increased recurrence risk for Mr. Fielder's offspring. We discussed that without more specific information, it  is difficult to provide an accurate risk assessment.  Further genetic counseling is warranted if more information is obtained.   Mrs. Sandoz was provided with written information regarding cystic fibrosis (CF) including the carrier frequency and incidence in the Caucasian population, the availability of carrier testing and prenatal diagnosis if indicated.  In addition, we discussed that CF is routinely screened for as part of the [place] newborn screening panel.  This was not discussed in detail today given the nature of today's discussion. If not previously performed and if desired, CF carrier screening is available to the patient.   I counseled this couple regarding the above risks and available options.  The approximate face-to-face time with the genetic counselor was 45 minutes.  Chipper Oman, MS Certified Genetic Counselor 12/22/2014   Addendum: Further investigation regarding the patient's specific chromosome rearrangement, which is a balanced small insertional translocation, indicates a 50% risk for unbalanced gametes [place] Drought and [place]). Unbalanced forms could include either partial trisomy 10q (as detected in the most recent miscarriage) or partial monosomy 10q. The unbalanced form could result in the birth of a child with physical and intellectual disability. However, unbalanced conception would be more likely to result in pregnancy loss, though the specific risk for each of these outcomes in the case of an unbalanced conception is difficult to quantify. Per communication with Dr. Burna Sis at the [facility], her models do not include data regarding  insertions.

## 2015-01-01 NOTE — Addendum Note (Signed)
Encounter addended by: Cottie Banda Zylon Creamer on: 01/01/2015 10:42 AM<BR>     Documentation filed: Notes Section, Letters

## 2015-01-02 ENCOUNTER — Other Ambulatory Visit (HOSPITAL_COMMUNITY): Payer: Self-pay | Admitting: Obstetrics and Gynecology

## 2015-01-22 ENCOUNTER — Ambulatory Visit (INDEPENDENT_AMBULATORY_CARE_PROVIDER_SITE_OTHER): Payer: Self-pay | Admitting: Family Medicine

## 2015-01-22 ENCOUNTER — Encounter: Payer: Self-pay | Admitting: Family Medicine

## 2015-01-22 VITALS — BP 118/70 | HR 72 | Temp 98.0°F | Resp 16 | Ht 65.0 in | Wt 150.0 lb

## 2015-01-22 DIAGNOSIS — Z7189 Other specified counseling: Secondary | ICD-10-CM

## 2015-01-22 DIAGNOSIS — Z7689 Persons encountering health services in other specified circumstances: Secondary | ICD-10-CM

## 2015-01-22 DIAGNOSIS — F909 Attention-deficit hyperactivity disorder, unspecified type: Secondary | ICD-10-CM

## 2015-01-22 DIAGNOSIS — F988 Other specified behavioral and emotional disorders with onset usually occurring in childhood and adolescence: Secondary | ICD-10-CM | POA: Insufficient documentation

## 2015-01-22 MED ORDER — AMPHETAMINE-DEXTROAMPHET ER 25 MG PO CP24: 25.0000 mg | ORAL_CAPSULE | ORAL | Status: DC

## 2015-01-22 NOTE — Progress Notes (Signed)
Patient ID: Erika [name], female   DOB: 07-Apr-1981, 34 y.o.   MRN: [id number]    Subjective:  HPI Pt is here to to Establish care she was seeing a Doctor in [place]. Dr. Adela Ports. She is feeling well today.  She was seeing this MD regularly for ADD. Being he was in [place] she needed a doctor closer to home. She will need Dr. Rosanna Randy to take over her Adderall XR 25 mg daily. She is currently controlled on this dosage.    Prior to Admission medications   Medication Sig Start Date End Date Taking? Authorizing Provider  amphetamine-dextroamphetamine (ADDERALL XR) 25 MG 24 hr capsule Take 25 mg by mouth every morning.   Yes Historical Provider, MD  Prenatal Vit-Fe Fumarate-FA (PRENATAL MULTIVITAMIN) TABS tablet Take 1 tablet by mouth daily at 12 noon.   Yes Historical Provider, MD    Patient Active Problem List   Diagnosis Date Noted  . ADD (attention deficit disorder) 01/22/2015  . 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 Asthma     History   Social History  . Marital Status: Married    Spouse Name: N/A  . Number of Children: N/A  . Years of Education: N/A   Occupational History  . Not on file.   Social History Main Topics  . Smoking status: Former Research scientist (life sciences)  . Smokeless tobacco: Not on file     Comment: in college occasionally, nothing heavy  . Alcohol Use: Yes     Comment: occaisional 1-2 drinks a week  . Drug Use: No  . Sexual Activity: Not on file   Other Topics Concern  . Not on file   Social History Narrative    No Known Allergies  Review of Systems  All other systems reviewed and are negative.    There is no immunization history on file for this patient. Objective:  BP 118/70 mmHg  Pulse 72  Temp(Src) 98 F (36.7 C) (Oral)  Resp 16  Ht 5\' 5"  (1.651 m)  Wt 150 lb (68.04 kg)  BMI 24.96 kg/m2  LMP 01/18/2015  Breastfeeding? No  Physical Exam  Constitutional: She is oriented to person, place,  and time and well-developed, well-nourished, and in no distress.  HENT:  Head: Normocephalic and atraumatic.  Right Ear: External ear normal.  Left Ear: External ear normal.  Nose: Nose normal.  Eyes: Conjunctivae and EOM are normal. Pupils are equal, round, and reactive to light.  Neck: Normal range of motion. Neck supple.  Cardiovascular: Normal rate, regular rhythm, normal heart sounds and intact distal pulses.   Pulmonary/Chest: Effort normal and breath sounds normal.  Abdominal: Soft. Bowel sounds are normal.  Musculoskeletal: Normal range of motion.  Neurological: She is alert and oriented to person, place, and time. She has normal reflexes. Gait normal. GCS score is 15.  Skin: Skin is warm and dry.  Psychiatric: Mood, memory, affect and judgment normal.    Lab Results  Component Value Date   WBC 3.2* 10/24/2014   HGB 14.4 10/24/2014   HCT 38.2 10/24/2014   PLT 231 10/24/2014    CMP  No results found for: NA, K, CL, CO2, GLUCOSE, BUN, CREATININE, CALCIUM, PROT, ALBUMIN, AST, ALT, ALKPHOS, BILITOT, GFRNONAA, GFRAA  Assessment and Plan :  1. ADD (attention deficit disorder) Refills times 3 Return to clinic for refills. She has been on this for years. May try to get her off of this if she is able  to have a child and be at home. I'm not sure of the present benefit of being on the medication. It does help her to function better. 2. Encounter to establish care  3. Status post D&C after miscarriage spring of 2016 Patient and husband not doing anything for contraception. She knows as soon as she gets pregnant to stop the Adderall completely. Patient was seen and examined by Dr. Miguel Aschoff, and noted scribed by Webb Laws, Kim MD [facility] 01/22/2015 3:46 PM

## 2015-04-12 LAB — OB RESULTS CONSOLE RPR: RPR: NONREACTIVE

## 2015-04-12 LAB — OB RESULTS CONSOLE RUBELLA ANTIBODY, IGM: Rubella: IMMUNE

## 2015-04-12 LAB — OB RESULTS CONSOLE HIV ANTIBODY (ROUTINE TESTING): HIV: NONREACTIVE

## 2015-04-12 LAB — OB RESULTS CONSOLE HEPATITIS B SURFACE ANTIGEN: Hepatitis B Surface Ag: NEGATIVE

## 2015-04-17 LAB — OB RESULTS CONSOLE GC/CHLAMYDIA
Chlamydia: NEGATIVE
GC PROBE AMP, GENITAL: NEGATIVE

## 2015-07-08 NOTE — L&D Delivery Note (Signed)
Delivery Note  SVD viable female Apgars 9,9 over 2nd degree MLE.  Placenta delivered spontaneously intact with 3VC. Repair with 2-0 Chromic with good support and hemostasis noted and R/V exam confirms.  PH art was sent.  [place] cord blood was not done.  Mother and baby were doing well.  EBL 250cc  Louretta Shorten, MD

## 2015-09-27 LAB — OB RESULTS CONSOLE GBS: GBS: NEGATIVE

## 2015-10-11 ENCOUNTER — Inpatient Hospital Stay (HOSPITAL_COMMUNITY)
Admission: AD | Admit: 2015-10-11 | Discharge: 2015-10-11 | Disposition: A | Discharge status: Home / Self Care | Payer: BLUE CROSS/BLUE SHIELD | Source: Ambulatory Visit | Attending: Obstetrics and Gynecology | Admitting: Obstetrics and Gynecology

## 2015-10-11 ENCOUNTER — Encounter (HOSPITAL_COMMUNITY): Payer: Self-pay | Admitting: *Deleted

## 2015-10-11 DIAGNOSIS — Z8249 Family history of ischemic heart disease and other diseases of the circulatory system: Secondary | ICD-10-CM | POA: Diagnosis not present

## 2015-10-11 DIAGNOSIS — O133 Gestational [pregnancy-induced] hypertension without significant proteinuria, third trimester: Secondary | ICD-10-CM | POA: Diagnosis not present

## 2015-10-11 DIAGNOSIS — J45909 Unspecified asthma, uncomplicated: Secondary | ICD-10-CM | POA: Insufficient documentation

## 2015-10-11 DIAGNOSIS — Z3A38 38 weeks gestation of pregnancy: Secondary | ICD-10-CM | POA: Diagnosis not present

## 2015-10-11 DIAGNOSIS — Z9889 Other specified postprocedural states: Secondary | ICD-10-CM | POA: Insufficient documentation

## 2015-10-11 DIAGNOSIS — Z87891 Personal history of nicotine dependence: Secondary | ICD-10-CM | POA: Diagnosis not present

## 2015-10-11 DIAGNOSIS — Z833 Family history of diabetes mellitus: Secondary | ICD-10-CM | POA: Insufficient documentation

## 2015-10-11 LAB — CBC
HCT: 32.3 % — ABNORMAL LOW (ref 36.0–46.0)
Hemoglobin: 11.2 g/dL — ABNORMAL LOW (ref 12.0–15.0)
MCH: 30.7 pg (ref 26.0–34.0)
MCHC: 34.7 g/dL (ref 30.0–36.0)
MCV: 88.5 fL (ref 78.0–100.0)
Platelets: 217 10*3/uL (ref 150–400)
RBC: 3.65 MIL/uL — ABNORMAL LOW (ref 3.87–5.11)
RDW: 13.9 % (ref 11.5–15.5)
WBC: 2.5 10*3/uL — ABNORMAL LOW (ref 4.0–10.5)

## 2015-10-11 LAB — COMPREHENSIVE METABOLIC PANEL
ALT: 12 U/L — ABNORMAL LOW (ref 14–54)
AST: 19 U/L (ref 15–41)
Albumin: 2.9 g/dL — ABNORMAL LOW (ref 3.5–5.0)
Alkaline Phosphatase: 160 U/L — ABNORMAL HIGH (ref 38–126)
Anion gap: 10 (ref 5–15)
BUN: 9 mg/dL (ref 6–20)
CO2: 20 mmol/L — ABNORMAL LOW (ref 22–32)
Calcium: 8.9 mg/dL (ref 8.9–10.3)
Chloride: 108 mmol/L (ref 101–111)
Creatinine, Ser: 0.57 mg/dL (ref 0.44–1.00)
GFR calc Af Amer: >60 mL/min (ref >60)
GFR calc non Af Amer: >60 mL/min (ref >60)
Glucose, Bld: 87 mg/dL (ref 65–99)
Potassium: 4 mmol/L (ref 3.5–5.1)
Sodium: 138 mmol/L (ref 135–145)
Total Bilirubin: 0.3 mg/dL (ref 0.3–1.2)
Total Protein: 6.6 g/dL (ref 6.5–8.1)

## 2015-10-11 LAB — URINALYSIS, ROUTINE W REFLEX MICROSCOPIC
Bilirubin Urine: NEGATIVE
Glucose, UA: NEGATIVE mg/dL
Hgb urine dipstick: NEGATIVE
Ketones, ur: NEGATIVE mg/dL
Nitrite: NEGATIVE
Protein, ur: NEGATIVE mg/dL
Specific Gravity, Urine: <1.005 — ABNORMAL LOW (ref 1.005–1.030)
pH: 7 (ref 5.0–8.0)

## 2015-10-11 LAB — PROTEIN / CREATININE RATIO, URINE
Creatinine, Urine: 30 mg/dL
PROTEIN CREATININE RATIO: 0.27 mg/mg{creat} — AB (ref 0.00–0.15)
Total Protein, Urine: 8 mg/dL

## 2015-10-11 LAB — URINE MICROSCOPIC-ADD ON: RBC / HPF: NONE SEEN RBC/hpf (ref 0–5)

## 2015-10-11 LAB — URIC ACID: Uric Acid, Serum: 6.3 mg/dL (ref 2.3–6.6)

## 2015-10-11 LAB — LACTATE DEHYDROGENASE: LDH: 155 U/L (ref 98–192)

## 2015-10-11 NOTE — MAU Provider Note (Signed)
History     CSN: [id number]  Arrival date and time: 10/11/15 1156   First Provider Initiated Contact with Patient 10/11/15 1304      Chief Complaint  Patient presents with  . Hypertension    sent from office for high BP   HPI   Ms.Erika [name] is a 35 y.o. female [id number] at 63w4dpresenting to MAU with elevated BP readings. She was seen in the office today and her pressures were elevated. She was sent here for further monitoring/ PIH labs.   OB History    Gravida Para Term Preterm AB TAB SAB Ectopic Multiple Living   4 0   3  3   0      Past Medical History  Diagnosis Date  . Asthma     Past Surgical History  Procedure Laterality Date  . Wisdom tooth extraction Left   . Dilation and evacuation N/A 10/24/2014    Procedure: DILATATION AND EVACUATION with genetic studies;  Surgeon: DLouretta Shorten MD;  [facility];  Service: Gynecology;  Laterality: N/A;  . Dilation and curettage of uterus  2016    from miscarriage    Family History  Problem Relation Age of Onset  . Heart disease Father   . Diabetes Father     Social History  Substance Use Topics  . Smoking status: Former SResearch scientist (life sciences) . Smokeless tobacco: None     Comment: in college occasionally, nothing heavy  . Alcohol Use: Yes     Comment: occaisional 1-2 drinks a week    Allergies: No Known Allergies  Prescriptions prior to admission  Medication Sig Dispense Refill Last Dose  . amphetamine-dextroamphetamine (ADDERALL XR) 25 MG 24 hr capsule Take 1 capsule by mouth every morning. 03/25/2015 30 capsule 0   . Prenatal Vit-Fe Fumarate-FA (PRENATAL MULTIVITAMIN) TABS tablet Take 1 tablet by mouth daily at 12 noon.   Taking   Results for orders placed or performed during the hospital encounter of 10/11/15 (from the past 48 hour(s))  Urinalysis, [facility] reflex microscopic [facility]     Status: Abnormal   Collection Time: 10/11/15 12:15 PM  Result Value Ref Range   Color, Urine YELLOW YELLOW   APPearance  HAZY (A) CLEAR   Specific Gravity, Urine <1.005 (L) 1.005 - 1.030   pH 7.0 5.0 - 8.0   Glucose, UA NEGATIVE NEGATIVE mg/dL   Hgb urine dipstick NEGATIVE NEGATIVE   Bilirubin Urine NEGATIVE NEGATIVE   Ketones, ur NEGATIVE NEGATIVE mg/dL   Protein, ur NEGATIVE NEGATIVE mg/dL   Nitrite NEGATIVE NEGATIVE   Leukocytes, UA TRACE (A) NEGATIVE  Protein / creatinine ratio, urine     Status: Abnormal   Collection Time: 10/11/15 12:15 PM  Result Value Ref Range   Creatinine, Urine 30.00 mg/dL   Total Protein, Urine 8 mg/dL    Comment: NO NORMAL RANGE ESTABLISHED FOR THIS TEST   Protein Creatinine Ratio 0.27 (H) 0.00 - 0.15 mg/mg[Cre]  Urine microscopic-add on     Status: Abnormal   Collection Time: 10/11/15 12:15 PM  Result Value Ref Range   Squamous Epithelial / LPF 0-5 (A) NONE SEEN   WBC, UA 0-5 0 - 5 WBC/hpf   RBC / HPF NONE SEEN 0 - 5 RBC/hpf   Bacteria, UA FEW (A) NONE SEEN  CBC     Status: Abnormal   Collection Time: 10/11/15 12:40 PM  Result Value Ref Range   WBC 2.5 (L) 4.0 - 10.5 K/uL   RBC  3.65 (L) 3.87 - 5.11 MIL/uL   Hemoglobin 11.2 (L) 12.0 - 15.0 g/dL   HCT 32.3 (L) 36.0 - 46.0 %   MCV 88.5 78.0 - 100.0 fL   MCH 30.7 26.0 - 34.0 pg   MCHC 34.7 30.0 - 36.0 g/dL   RDW 13.9 11.5 - 15.5 %   Platelets 217 150 - 400 K/uL  Comprehensive metabolic panel     Status: Abnormal   Collection Time: 10/11/15 12:40 PM  Result Value Ref Range   Sodium 138 135 - 145 mmol/L   Potassium 4.0 3.5 - 5.1 mmol/L   Chloride 108 101 - 111 mmol/L   CO2 20 (L) 22 - 32 mmol/L   Glucose, Bld 87 65 - 99 mg/dL   BUN 9 6 - 20 mg/dL   Creatinine, Ser 0.57 0.44 - 1.00 mg/dL   Calcium 8.9 8.9 - 10.3 mg/dL   Total Protein 6.6 6.5 - 8.1 g/dL   Albumin 2.9 (L) 3.5 - 5.0 g/dL   AST 19 15 - 41 U/L   ALT 12 (L) 14 - 54 U/L   Alkaline Phosphatase 160 (H) 38 - 126 U/L   Total Bilirubin 0.3 0.3 - 1.2 mg/dL   GFR calc non Af Amer >60 >60 mL/min   GFR calc Af Amer >60 >60 mL/min    Comment: (NOTE) The  eGFR has been calculated using the CKD EPI equation. This calculation has not been validated in all clinical situations. eGFR's persistently <60 mL/min signify possible Chronic Kidney Disease.    Anion gap 10 5 - 15  Uric acid     Status: None   Collection Time: 10/11/15 12:40 PM  Result Value Ref Range   Uric Acid, Serum 6.3 2.3 - 6.6 mg/dL  Lactate dehydrogenase     Status: None   Collection Time: 10/11/15 12:40 PM  Result Value Ref Range   LDH 155 98 - 192 U/L    Review of Systems  Eyes: Negative for blurred vision.  Cardiovascular: Positive for leg swelling.  Gastrointestinal: Negative for abdominal pain.  Neurological: Negative for headaches.   Physical Exam   Blood pressure 134/84, pulse 97, temperature 98.2 F (36.8 C), temperature source Oral, resp. rate 18, height 5' 5"  (1.651 m), weight 186 lb (84.369 kg), last menstrual period 01/14/2015, SpO2 98 %.   Patient Vitals for the past 24 hrs:  BP Temp Temp src Pulse Resp SpO2 Height Weight  10/11/15 1330 138/87 mmHg - - 92 - - - -  10/11/15 1315 131/61 mmHg - - 93 - - - -  10/11/15 1300 113/94 mmHg - - 100 - - - -  10/11/15 1245 132/92 mmHg - - 96 - - - -  10/11/15 1241 121/89 mmHg - - 107 - - - -  10/11/15 1230 135/88 mmHg 98.2 F (36.8 C) Oral 111 18 98 % 5' 5"  (1.651 m) 186 lb (84.369 kg)    Physical Exam  Constitutional: She is oriented to person, place, and time. She appears well-developed and well-nourished. No distress.  HENT:  Head: Normocephalic.  Eyes: Pupils are equal, round, and reactive to light.  Respiratory: Effort normal.  Musculoskeletal: She exhibits edema.  Negative clonus.   Neurological: She is alert and oriented to person, place, and time. She displays normal reflexes.  Skin: Skin is warm. She is not diaphoretic.  Psychiatric: Her behavior is normal.    Fetal Tracing: Baseline: 145 bpm  Variability: Moderate Accelerations: 15x15 Decelerations: None Toco: UI   MAU  Course  Procedures   None  MDM  CMP CBC UA  Uric acid  LDH   Dr. Gaetano Net to MAU to see the patient in room 7  Assessment and Plan   A:  1. Gestational hypertension, third trimester     P:  Discharge home in stable condition Preeclampsia precautions Follow up with OB in 1 day for BP check Return to MAU if symptoms worsen Kick counts.  Lezlie Lye, NP 10/11/2015 2:43 PM

## 2015-10-11 NOTE — Discharge Instructions (Signed)
Hypertension During Pregnancy °Hypertension is also called high blood pressure. Blood pressure moves blood in your body. Sometimes, the force that moves the blood becomes too strong. When you are pregnant, this condition should be watched carefully. It can cause problems for you and your baby. °HOME CARE  °· Make and keep all of your doctor visits. °· Take medicine as told by your doctor. Tell your doctor about all medicines you take. °· Eat very little salt. °· Exercise regularly. °· Do not drink alcohol. °· Do not smoke. °· Do not have drinks with caffeine. °· Lie on your left side when resting. °· Your health care provider may ask you to take one low-dose aspirin (81mg) each day. °GET HELP RIGHT AWAY IF: °· You have bad belly (abdominal) pain. °· You have sudden puffiness (swelling) in the hands, ankles, or face. °· You gain 4 pounds (1.8 kilograms) or more in 1 week. °· You throw up (vomit) repeatedly. °· You have bleeding from the vagina. °· You do not feel the baby moving as much. °· You have a headache. °· You have blurred or double vision. °· You have muscle twitching or spasms. °· You have shortness of breath. °· You have blue fingernails and lips. °· You have blood in your pee (urine). °MAKE SURE YOU: °· Understand these instructions. °· Will watch your condition. °· Will get help right away if you are not doing well or get worse. °  °This information is not intended to replace advice given to you by your health care provider. Make sure you discuss any questions you have with your health care provider. °  °Document Released: 07/26/2010 Document Revised: 07/14/2014 Document Reviewed: 01/20/2013 °Elsevier Interactive Patient Education ©2016 [employer] ° °

## 2015-10-11 NOTE — MAU Note (Signed)
"  My blood pressure started creeping up last week and was high in the office today.  I think they want me to have the baby today."

## 2015-10-11 NOTE — Progress Notes (Signed)
History     CSN: [id number]  Arrival date & time 10/11/15  1156   First Provider Initiated Contact with Patient 10/11/15 1304      Chief Complaint  Patient presents with  . Hypertension    sent from office for high BP    HPI 35 yo G4P0 @ 38 4/7 weeks with BP of 152/98 in office. No HA, no vision change, no epigastric pain, no leaking, no bleeding. Sent from office for evaluation. Past Medical History  Diagnosis Date  . Asthma     Past Surgical History  Procedure Laterality Date  . Wisdom tooth extraction Left   . Dilation and evacuation N/A 10/24/2014    Procedure: DILATATION AND EVACUATION with genetic studies;  Surgeon: Louretta Shorten, MD;  Location: [facility];  Service: Gynecology;  Laterality: N/A;  . Dilation and curettage of uterus  2016    from miscarriage    Family History  Problem Relation Age of Onset  . Heart disease Father   . Diabetes Father     Social History  Substance Use Topics  . Smoking status: Former Research scientist (life sciences)  . Smokeless tobacco: None     Comment: in college occasionally, nothing heavy  . Alcohol Use: Yes     Comment: occaisional 1-2 drinks a week    OB History    Gravida Para Term Preterm AB TAB SAB Ectopic Multiple Living   4 0   3  3   0      Review of Systems  Allergies  Review of patient's allergies indicates no known allergies.  Home Medications  No current outpatient prescriptions on file.  BP 126/82 mmHg  Pulse 86  Temp(Src) 98.2 F (36.8 C) (Oral)  Resp 18  Ht 5\' 5"  (1.651 m)  Wt 186 lb (84.369 kg)  BMI 30.95 kg/m2  SpO2 98%  LMP 01/14/2015  Physical Exam  Lungs CTA Cor RRR Abdomen gravid, NT, no epigastric tenderness DTR 2+ without clonus  FHT cat one  MAU Course  Procedures (including critical care time)  Labs Reviewed  URINALYSIS, ROUTINE W REFLEX MICROSCOPIC (NOT AT [facility]) - Abnormal; Notable for the following:    APPearance HAZY (*)    Specific Gravity, Urine <1.005 (*)    Leukocytes, UA TRACE (*)    All  other components within normal limits  CBC - Abnormal; Notable for the following:    WBC 2.5 (*)    RBC 3.65 (*)    Hemoglobin 11.2 (*)    HCT 32.3 (*)    All other components within normal limits  COMPREHENSIVE METABOLIC PANEL - Abnormal; Notable for the following:    CO2 20 (*)    Albumin 2.9 (*)    ALT 12 (*)    Alkaline Phosphatase 160 (*)    All other components within normal limits  PROTEIN / CREATININE RATIO, URINE - Abnormal; Notable for the following:    Protein Creatinine Ratio 0.27 (*)    All other components within normal limits  URINE MICROSCOPIC-ADD ON - Abnormal; Notable for the following:    Squamous Epithelial / LPF 0-5 (*)    Bacteria, UA FEW (*)    All other components within normal limits  URIC ACID  LACTATE DEHYDROGENASE   No results found.   No diagnosis found.    MDM  35 yo G4P0 @ 38 4/7 weeks with labile BP D/C home, MBR, preeclampsia symptoms reviewed FU office 1 day

## 2015-10-15 ENCOUNTER — Encounter (HOSPITAL_COMMUNITY): Payer: Self-pay | Admitting: *Deleted

## 2015-10-15 ENCOUNTER — Inpatient Hospital Stay (HOSPITAL_COMMUNITY)
Admission: AD | Admit: 2015-10-15 | Discharge: 2015-10-17 | DRG: 775 | Disposition: A | Discharge status: Home / Self Care | Payer: BLUE CROSS/BLUE SHIELD | Source: Ambulatory Visit | Attending: Obstetrics and Gynecology | Admitting: Obstetrics and Gynecology

## 2015-10-15 DIAGNOSIS — O9952 Diseases of the respiratory system complicating childbirth: Secondary | ICD-10-CM | POA: Diagnosis present

## 2015-10-15 DIAGNOSIS — J45909 Unspecified asthma, uncomplicated: Secondary | ICD-10-CM | POA: Diagnosis present

## 2015-10-15 DIAGNOSIS — Z87891 Personal history of nicotine dependence: Secondary | ICD-10-CM | POA: Diagnosis not present

## 2015-10-15 DIAGNOSIS — Z8249 Family history of ischemic heart disease and other diseases of the circulatory system: Secondary | ICD-10-CM

## 2015-10-15 DIAGNOSIS — Z833 Family history of diabetes mellitus: Secondary | ICD-10-CM

## 2015-10-15 DIAGNOSIS — Z3A39 39 weeks gestation of pregnancy: Secondary | ICD-10-CM | POA: Diagnosis not present

## 2015-10-15 DIAGNOSIS — O134 Gestational [pregnancy-induced] hypertension without significant proteinuria, complicating childbirth: Secondary | ICD-10-CM | POA: Diagnosis present

## 2015-10-15 DIAGNOSIS — O139 Gestational [pregnancy-induced] hypertension without significant proteinuria, unspecified trimester: Secondary | ICD-10-CM | POA: Diagnosis present

## 2015-10-15 LAB — CBC
HCT: 31 % — ABNORMAL LOW (ref 36.0–46.0)
HEMOGLOBIN: 10.5 g/dL — AB (ref 12.0–15.0)
MCH: 30.1 pg (ref 26.0–34.0)
MCHC: 33.9 g/dL (ref 30.0–36.0)
MCV: 88.8 fL (ref 78.0–100.0)
PLATELETS: 215 10*3/uL (ref 150–400)
RBC: 3.49 MIL/uL — ABNORMAL LOW (ref 3.87–5.11)
RDW: 13.8 % (ref 11.5–15.5)
WBC: 2.3 10*3/uL — ABNORMAL LOW (ref 4.0–10.5)

## 2015-10-15 LAB — COMPREHENSIVE METABOLIC PANEL
ALBUMIN: 2.8 g/dL — AB (ref 3.5–5.0)
ALK PHOS: 156 U/L — AB (ref 38–126)
ALT: 12 U/L — ABNORMAL LOW (ref 14–54)
ANION GAP: 9 (ref 5–15)
AST: 20 U/L (ref 15–41)
BUN: 9 mg/dL (ref 6–20)
CHLORIDE: 109 mmol/L (ref 101–111)
CO2: 18 mmol/L — AB (ref 22–32)
Calcium: 8.8 mg/dL — ABNORMAL LOW (ref 8.9–10.3)
Creatinine, Ser: 0.66 mg/dL (ref 0.44–1.00)
GFR calc non Af Amer: >60 mL/min (ref >60)
GLUCOSE: 99 mg/dL (ref 65–99)
POTASSIUM: 3.8 mmol/L (ref 3.5–5.1)
SODIUM: 136 mmol/L (ref 135–145)
Total Bilirubin: 0.3 mg/dL (ref 0.3–1.2)
Total Protein: 6.3 g/dL — ABNORMAL LOW (ref 6.5–8.1)

## 2015-10-15 LAB — TYPE AND SCREEN
ABO/RH(D): A POS
Antibody Screen: NEGATIVE

## 2015-10-15 LAB — URIC ACID: URIC ACID, SERUM: 6.8 mg/dL — AB (ref 2.3–6.6)

## 2015-10-15 LAB — LACTATE DEHYDROGENASE: LDH: 145 U/L (ref 98–192)

## 2015-10-15 LAB — PROTEIN / CREATININE RATIO, URINE
Creatinine, Urine: 96 mg/dL
PROTEIN CREATININE RATIO: 0.22 mg/mg{creat} — AB (ref 0.00–0.15)
TOTAL PROTEIN, URINE: 21 mg/dL

## 2015-10-15 LAB — ABO/RH: ABO/RH(D): A POS

## 2015-10-15 MED ORDER — BUTORPHANOL TARTRATE 1 MG/ML IJ SOLN
1.0000 mg | INTRAMUSCULAR | Status: DC | PRN
  Administered 2015-10-16: 1 mg via INTRAVENOUS
  Filled 2015-10-15: qty 1

## 2015-10-15 MED ORDER — ONDANSETRON HCL 4 MG/2ML IJ SOLN: 4.0000 mg | Freq: Four times a day (QID) | INTRAMUSCULAR | Status: DC | PRN

## 2015-10-15 MED ORDER — LACTATED RINGERS IV SOLN: 500.0000 mL | Freq: Once | INTRAVENOUS | Status: DC

## 2015-10-15 MED ORDER — OXYCODONE-ACETAMINOPHEN 5-325 MG PO TABS
1.0000 | ORAL_TABLET | ORAL | Status: DC | PRN
Start: 2015-10-15 — End: 2015-10-16

## 2015-10-15 MED ORDER — DIPHENHYDRAMINE HCL 50 MG/ML IJ SOLN: 12.5000 mg | INTRAMUSCULAR | Status: DC | PRN

## 2015-10-15 MED ORDER — CITRIC ACID-SODIUM CITRATE 334-500 MG/5ML PO SOLN: 30.0000 mL | ORAL | Status: DC | PRN

## 2015-10-15 MED ORDER — FENTANYL 2.5 MCG/ML BUPIVACAINE 1/10 % EPIDURAL INFUSION (WH - ANES)
14.0000 mL/h | INTRAMUSCULAR | Status: DC | PRN
  Administered 2015-10-16 (×3): 14 mL/h via EPIDURAL
  Filled 2015-10-15 (×2): qty 125

## 2015-10-15 MED ORDER — PHENYLEPHRINE 40 MCG/ML (10ML) SYRINGE FOR IV PUSH (FOR BLOOD PRESSURE SUPPORT)
80.0000 ug | PREFILLED_SYRINGE | INTRAVENOUS | Status: DC | PRN
  Filled 2015-10-15: qty 2

## 2015-10-15 MED ORDER — PHENYLEPHRINE 40 MCG/ML (10ML) SYRINGE FOR IV PUSH (FOR BLOOD PRESSURE SUPPORT)
80.0000 ug | PREFILLED_SYRINGE | INTRAVENOUS | Status: DC | PRN
  Filled 2015-10-15 (×2): qty 20
  Filled 2015-10-15: qty 2

## 2015-10-15 MED ORDER — MISOPROSTOL 25 MCG QUARTER TABLET
25.0000 ug | ORAL_TABLET | ORAL | Status: DC | PRN
  Filled 2015-10-15: qty 0.25

## 2015-10-15 MED ORDER — OXYTOCIN 10 UNIT/ML IJ SOLN: 2.5000 [IU]/h | INTRAVENOUS | Status: DC

## 2015-10-15 MED ORDER — ZOLPIDEM TARTRATE 5 MG PO TABS: 10.0000 mg | ORAL_TABLET | Freq: Every evening | ORAL | Status: DC | PRN

## 2015-10-15 MED ORDER — EPHEDRINE 5 MG/ML INJ
10.0000 mg | INTRAVENOUS | Status: DC | PRN
  Filled 2015-10-15: qty 2

## 2015-10-15 MED ORDER — LACTATED RINGERS IV SOLN: 500.0000 mL | INTRAVENOUS | Status: DC | PRN

## 2015-10-15 MED ORDER — ACETAMINOPHEN 325 MG PO TABS: 650.0000 mg | ORAL_TABLET | ORAL | Status: DC | PRN

## 2015-10-15 MED ORDER — MISOPROSTOL 25 MCG QUARTER TABLET
25.0000 ug | ORAL_TABLET | ORAL | Status: AC | PRN
  Administered 2015-10-15 (×2): 25 ug via VAGINAL
  Filled 2015-10-15: qty 0.25

## 2015-10-15 MED ORDER — OXYCODONE-ACETAMINOPHEN 5-325 MG PO TABS
2.0000 | ORAL_TABLET | ORAL | Status: DC | PRN
Start: 2015-10-15 — End: 2015-10-16

## 2015-10-15 MED ORDER — PROMETHAZINE HCL 25 MG/ML IJ SOLN: 12.5000 mg | INTRAMUSCULAR | Status: DC | PRN

## 2015-10-15 MED ORDER — LACTATED RINGERS IV SOLN
INTRAVENOUS | Status: DC
  Administered 2015-10-16: 02:00:00 via INTRAVENOUS

## 2015-10-15 MED ORDER — OXYTOCIN BOLUS FROM INFUSION: 500.0000 mL | INTRAVENOUS | Status: DC

## 2015-10-15 MED ORDER — TERBUTALINE SULFATE 1 MG/ML IJ SOLN
0.2500 mg | Freq: Once | INTRAMUSCULAR | Status: DC | PRN
  Filled 2015-10-15: qty 1

## 2015-10-15 MED ORDER — LIDOCAINE HCL (PF) 1 % IJ SOLN
30.0000 mL | INTRAMUSCULAR | Status: DC | PRN
  Filled 2015-10-15: qty 30

## 2015-10-15 NOTE — H&P (Signed)
Erika [name] is a 35 y.o. female presenting for IOL due to Gestational HTN without severe features.  Has had elevated BPs the last 4 weeks, with serial labs, increased rest.  No severe features.  Plan IOL.  GBS -. History OB History    Gravida Para Term Preterm AB TAB SAB Ectopic Multiple Living   4 0   3  3   0     Past Medical History  Diagnosis Date  . Asthma    Past Surgical History  Procedure Laterality Date  . Wisdom tooth extraction Left   . Dilation and evacuation N/A 10/24/2014    Procedure: DILATATION AND EVACUATION with genetic studies;  Surgeon: Louretta Shorten, MD;  Location: [facility];  Service: Gynecology;  Laterality: N/A;  . Dilation and curettage of uterus  2016    from miscarriage   Family History: family history includes Diabetes in her father; Heart disease in her father. Social History:  reports that she has quit smoking. She does not have any smokeless tobacco history on file. She reports that she drinks alcohol. She reports that she does not use illicit drugs.   Prenatal Transfer Tool  Maternal Diabetes: No Genetic Screening: Normal Maternal Ultrasounds/Referrals: Normal Fetal Ultrasounds or other Referrals:  None Maternal Substance Abuse:  No Significant Maternal Medications:  None Significant Maternal Lab Results:  None Other Comments:  None  ROS    Last menstrual period 01/14/2015. Exam Physical Exam   Cx Cl th -3 soft and anterior 1-2+ edema, DTRs 2/4 Prenatal labs: ABO, Rh:   Antibody:   Rubella:   RPR:    HBsAg:    HIV:    GBS:     Assessment/Plan: IUP at 47 1/7 Gest HTN  W/o severe features.  Check labs Cytotec tonight for 2 stage IOL   Zorawar Strollo C 10/15/2015, 5:02 PM

## 2015-10-16 ENCOUNTER — Inpatient Hospital Stay (HOSPITAL_COMMUNITY): Payer: BLUE CROSS/BLUE SHIELD | Admitting: Anesthesiology

## 2015-10-16 ENCOUNTER — Encounter (HOSPITAL_COMMUNITY): Payer: Self-pay

## 2015-10-16 LAB — CBC
HCT: 31.1 % — ABNORMAL LOW (ref 36.0–46.0)
HCT: 30.4 % — ABNORMAL LOW (ref 36.0–46.0)
HEMOGLOBIN: 10.2 g/dL — AB (ref 12.0–15.0)
Hemoglobin: 10.5 g/dL — ABNORMAL LOW (ref 12.0–15.0)
MCH: 30 pg (ref 26.0–34.0)
MCH: 29.8 pg (ref 26.0–34.0)
MCHC: 33.8 g/dL (ref 30.0–36.0)
MCHC: 33.6 g/dL (ref 30.0–36.0)
MCV: 88.9 fL (ref 78.0–100.0)
MCV: 88.9 fL (ref 78.0–100.0)
Platelets: 202 10*3/uL (ref 150–400)
Platelets: 196 10*3/uL (ref 150–400)
RBC: 3.5 MIL/uL — ABNORMAL LOW (ref 3.87–5.11)
RBC: 3.42 MIL/uL — ABNORMAL LOW (ref 3.87–5.11)
RDW: 14 % (ref 11.5–15.5)
RDW: 14 % (ref 11.5–15.5)
WBC: 3.2 10*3/uL — ABNORMAL LOW (ref 4.0–10.5)
WBC: 2.1 10*3/uL — ABNORMAL LOW (ref 4.0–10.5)

## 2015-10-16 LAB — RPR: RPR: NONREACTIVE

## 2015-10-16 MED ORDER — PRENATAL MULTIVITAMIN CH
1.0000 | ORAL_TABLET | Freq: Every day | ORAL | Status: DC
  Administered 2015-10-17: 1 via ORAL
  Filled 2015-10-16: qty 1

## 2015-10-16 MED ORDER — LANOLIN HYDROUS EX OINT
TOPICAL_OINTMENT | CUTANEOUS | Status: DC | PRN
Start: 2015-10-16 — End: 2015-10-17

## 2015-10-16 MED ORDER — MISOPROSTOL 200 MCG PO TABS
50.0000 ug | ORAL_TABLET | Freq: Once | ORAL | Status: DC
  Filled 2015-10-16: qty 0.5

## 2015-10-16 MED ORDER — DIPHENHYDRAMINE HCL 25 MG PO CAPS: 25.0000 mg | ORAL_CAPSULE | Freq: Four times a day (QID) | ORAL | Status: DC | PRN

## 2015-10-16 MED ORDER — ONDANSETRON HCL 4 MG/2ML IJ SOLN: 4.0000 mg | INTRAMUSCULAR | Status: DC | PRN

## 2015-10-16 MED ORDER — ONDANSETRON HCL 4 MG PO TABS: 4.0000 mg | ORAL_TABLET | ORAL | Status: DC | PRN

## 2015-10-16 MED ORDER — WITCH HAZEL-GLYCERIN EX PADS
1.0000 | MEDICATED_PAD | CUTANEOUS | Status: DC | PRN
Start: 2015-10-16 — End: 2015-10-17

## 2015-10-16 MED ORDER — MEASLES, MUMPS & RUBELLA VAC ~~LOC~~ INJ
0.5000 mL | INJECTION | Freq: Once | SUBCUTANEOUS | Status: DC
  Filled 2015-10-16: qty 0.5

## 2015-10-16 MED ORDER — LIDOCAINE HCL (PF) 1 % IJ SOLN
INTRAMUSCULAR | Status: DC | PRN
  Administered 2015-10-16 (×2): 4 mL

## 2015-10-16 MED ORDER — IBUPROFEN 600 MG PO TABS
600.0000 mg | ORAL_TABLET | Freq: Four times a day (QID) | ORAL | Status: DC
  Administered 2015-10-16 – 2015-10-17 (×5): 600 mg via ORAL
  Filled 2015-10-16 (×5): qty 1

## 2015-10-16 MED ORDER — OXYTOCIN 10 UNIT/ML IJ SOLN
1.0000 m[IU]/min | INTRAVENOUS | Status: DC
  Administered 2015-10-16: 1 m[IU]/min via INTRAVENOUS
  Filled 2015-10-16: qty 4

## 2015-10-16 MED ORDER — TETANUS-DIPHTH-ACELL PERTUSSIS 5-2.5-18.5 LF-MCG/0.5 IM SUSP: 0.5000 mL | Freq: Once | INTRAMUSCULAR | Status: DC

## 2015-10-16 MED ORDER — DIBUCAINE 1 % RE OINT
1.0000 | TOPICAL_OINTMENT | RECTAL | Status: DC | PRN
Start: 2015-10-16 — End: 2015-10-17

## 2015-10-16 MED ORDER — OXYCODONE-ACETAMINOPHEN 5-325 MG PO TABS: 2.0000 | ORAL_TABLET | ORAL | Status: DC | PRN

## 2015-10-16 MED ORDER — SIMETHICONE 80 MG PO CHEW: 80.0000 mg | CHEWABLE_TABLET | ORAL | Status: DC | PRN

## 2015-10-16 MED ORDER — ACETAMINOPHEN 325 MG PO TABS: 650.0000 mg | ORAL_TABLET | ORAL | Status: DC | PRN

## 2015-10-16 MED ORDER — BENZOCAINE-MENTHOL 20-0.5 % EX AERO
1.0000 | INHALATION_SPRAY | CUTANEOUS | Status: DC | PRN
Start: 2015-10-16 — End: 2015-10-17
  Administered 2015-10-16: 1 via TOPICAL
  Filled 2015-10-16: qty 56

## 2015-10-16 MED ORDER — OXYCODONE-ACETAMINOPHEN 5-325 MG PO TABS: 1.0000 | ORAL_TABLET | ORAL | Status: DC | PRN

## 2015-10-16 MED ORDER — ZOLPIDEM TARTRATE 5 MG PO TABS: 5.0000 mg | ORAL_TABLET | Freq: Every evening | ORAL | Status: DC | PRN

## 2015-10-16 MED ORDER — MEDROXYPROGESTERONE ACETATE 150 MG/ML IM SUSP: 150.0000 mg | INTRAMUSCULAR | Status: DC | PRN

## 2015-10-16 MED ORDER — SENNOSIDES-DOCUSATE SODIUM 8.6-50 MG PO TABS
2.0000 | ORAL_TABLET | ORAL | Status: DC
  Administered 2015-10-17: 2 via ORAL
  Filled 2015-10-16: qty 2

## 2015-10-16 MED ORDER — TERBUTALINE SULFATE 1 MG/ML IJ SOLN
0.2500 mg | Freq: Once | INTRAMUSCULAR | Status: DC | PRN
  Filled 2015-10-16: qty 1

## 2015-10-16 NOTE — Progress Notes (Signed)
Patient ID: Erika [name], female   DOB: 21-Jun-1981, 35 y.o.   MRN: [id number] Pt comforatable with epidural VSSAF FHR 140s Cat 1 ctxs q 2-3  Cx C/C/+1  Term preg - 2nd stage Anticipate SVD Gest Htn - BPs controlled, No severe sxs

## 2015-10-16 NOTE — Lactation Note (Signed)
This note was copied from a baby's chart. Lactation Consultation Note  Patient Name: Girl Deklyn Likely [id number] Date: 10/16/2015 Reason for consult: Initial assessment Baby at 6 hr of life and mom reports bf is going well. She denies breast or nipple pain, no concerns voiced. She stated that she has been manually expressing to get baby to latch. She has seen colostrum bilaterally and has spoon in room. Discussed baby behavior, feeding frequency, baby belly size, voids, wt loss, breast changes, and nipple care. Given lactation handouts. Aware of OP services and support group.    Maternal Data Has patient been taught Hand Expression?: Yes Does the patient have breastfeeding experience prior to this delivery?: No  Feeding Feeding Type: Breast Fed  LATCH Score/Interventions                      Lactation Tools Discussed/Used WIC Program: No   Consult Status Consult Status: Follow-up Date: 10/17/15 Follow-up type: In-patient    Denzil Hughes 10/16/2015, 5:11 PM

## 2015-10-16 NOTE — Anesthesia Preprocedure Evaluation (Signed)
Anesthesia Evaluation  Patient identified by MRN, date of birth, ID band Patient awake    Reviewed: Allergy & Precautions, NPO status , Patient's Chart, lab work & pertinent test results  History of Anesthesia Complications Negative for: history of anesthetic complications  Airway Mallampati: II  TM Distance: >3 FB Neck ROM: Full    Dental no notable dental hx. (+) Dental Advisory Given   Pulmonary asthma , former smoker,    Pulmonary exam normal breath sounds clear to auscultation       Cardiovascular hypertension, Normal cardiovascular exam Rhythm:Regular Rate:Normal     Neuro/Psych negative neurological ROS  negative psych ROS   GI/Hepatic negative GI ROS, Neg liver ROS,   Endo/Other  obesity  Renal/GU negative Renal ROS  negative genitourinary   Musculoskeletal negative musculoskeletal ROS (+)   Abdominal   Peds negative pediatric ROS (+)  Hematology negative hematology ROS (+)   Anesthesia Other Findings   Reproductive/Obstetrics (+) Pregnancy                             Anesthesia Physical Anesthesia Plan  ASA: II  Anesthesia Plan: Epidural   Post-op Pain Management:    Induction:   Airway Management Planned:   Additional Equipment:   Intra-op Plan:   Post-operative Plan:   Informed Consent: I have reviewed the patients History and Physical, chart, labs and discussed the procedure including the risks, benefits and alternatives for the proposed anesthesia with the patient or authorized representative who has indicated his/her understanding and acceptance.   Dental advisory given  Plan Discussed with: CRNA  Anesthesia Plan Comments:         Anesthesia Quick Evaluation

## 2015-10-16 NOTE — Anesthesia Procedure Notes (Signed)
Epidural Patient location during procedure: OB  Staffing Anesthesiologist: Ia Leeb Performed by: anesthesiologist   Preanesthetic Checklist Completed: patient identified, site marked, surgical consent, pre-op evaluation, timeout performed, IV checked, risks and benefits discussed and monitors and equipment checked  Epidural Patient position: sitting Prep: site prepped and draped and DuraPrep Patient monitoring: continuous pulse ox and blood pressure Approach: midline Location: L3-L4 Injection technique: LOR saline  Needle:  Needle type: Tuohy  Needle gauge: 17 G Needle length: 9 cm and 9 Needle insertion depth: 6 cm Catheter type: closed end flexible Catheter size: 19 Gauge Catheter at skin depth: 11 cm Test dose: negative  Assessment Events: blood not aspirated, injection not painful, no injection resistance, negative IV test and no paresthesia  Additional Notes Patient identified. Risks/Benefits/Options discussed with patient including but not limited to bleeding, infection, nerve damage, paralysis, failed block, incomplete pain control, headache, blood pressure changes, nausea, vomiting, reactions to medication both or allergic, itching and postpartum back pain. Confirmed with bedside nurse the patient's most recent platelet count. Confirmed with patient that they are not currently taking any anticoagulation, have any bleeding history or any family history of bleeding disorders. Patient expressed understanding and wished to proceed. All questions were answered. Sterile technique was used throughout the entire procedure. Please see nursing notes for vital signs. Test dose was given through epidural catheter and negative prior to continuing to dose epidural or start infusion. Warning signs of high block given to the patient including shortness of breath, tingling/numbness in hands, complete motor block, or any concerning symptoms with instructions to call for help. Patient was  given instructions on fall risk and not to get out of bed. All questions and concerns addressed with instructions to call with any issues or inadequate analgesia.

## 2015-10-17 LAB — CBC
HCT: 26.1 % — ABNORMAL LOW (ref 36.0–46.0)
Hemoglobin: 8.8 g/dL — ABNORMAL LOW (ref 12.0–15.0)
MCH: 30 pg (ref 26.0–34.0)
MCHC: 33.7 g/dL (ref 30.0–36.0)
MCV: 89.1 fL (ref 78.0–100.0)
Platelets: 169 10*3/uL (ref 150–400)
RBC: 2.93 MIL/uL — ABNORMAL LOW (ref 3.87–5.11)
RDW: 14.2 % (ref 11.5–15.5)
WBC: 3.2 10*3/uL — ABNORMAL LOW (ref 4.0–10.5)

## 2015-10-17 NOTE — Discharge Summary (Signed)
Obstetric Discharge Summary Reason for Admission: induction of labor Prenatal Procedures: ultrasound Intrapartum Procedures: spontaneous vaginal delivery Postpartum Procedures: none Complications-Operative and Postpartum: 2 degree perineal laceration HEMOGLOBIN  Date Value Ref Range Status  10/17/2015 8.8* 12.0 - 15.0 g/dL Final   HCT  Date Value Ref Range Status  10/17/2015 26.1* 36.0 - 46.0 % Final    Physical Exam:  General: alert and cooperative Lochia: appropriate Uterine Fundus: firm Incision: healing well, small labial edema noted DVT Evaluation: No evidence of DVT seen on physical exam. Negative Homan's sign. No cords or calf tenderness. No significant calf/ankle edema.  Discharge Diagnoses: Term Pregnancy-delivered  Discharge Information: Date: 10/17/2015 Activity: pelvic rest Diet: routine Medications: PNV and Ibuprofen Condition: stable Instructions: refer to practice specific booklet Discharge to: home   Newborn Data: Live born female  Birth Weight: 8 lb 4.3 oz (3750 g) APGAR: 9, 9  Home with mother.  CURTIS,CAROL G 10/17/2015, 8:16 AM

## 2015-10-17 NOTE — Anesthesia Postprocedure Evaluation (Signed)
Anesthesia Post Note  Patient: Erika [name]  Procedure(s) Performed: * No procedures listed *  Patient location during evaluation: Mother Baby Anesthesia Type: Epidural Level of consciousness: awake and alert and patient cooperative Pain management: pain level controlled Vital Signs Assessment: post-procedure vital signs reviewed and stable Respiratory status: spontaneous breathing Cardiovascular status: stable Postop Assessment: no headache, no backache, patient able to bend at knees, no signs of nausea or vomiting and adequate PO intake Anesthetic complications: no Comments: Pain level 2    Last Vitals:  Filed Vitals:   10/17/15 0315 10/17/15 0547  BP: 136/79 108/55  Pulse: 90 80  Temp: 36.3 C 36.3 C  Resp: 18 18    Last Pain:  Filed Vitals:   10/17/15 0549  PainSc: 0-No pain                 Maicol Bowland

## 2015-10-17 NOTE — Lactation Note (Addendum)
This note was copied from a baby's chart. Lactation Consultation Note  Patient Name: Erika [name] [id number] Date: 10/17/2015   Baby 17 hours old. Asked to assess baby's latch prior to early D/C by CN nurse, Jana Half, RN. Offered to assist with latching baby, but mom declined stating that the baby had nursed 30 minutes prior, and she did not want to upset the baby at this time because she didn't want the baby's temperature "to go up again." This LC's phone number placed on patient's dry erase board and asked parents to call when ready to nurse the baby again so that this McKinnon could assess and assist with latch.   Maternal Data    Feeding Feeding Type: Breast Fed Length of feed: 30 min  LATCH Score/Interventions Latch: Grasps breast easily, tongue down, lips flanged, rhythmical sucking. Intervention(s): Adjust position;Assist with latch;Breast massage;Breast compression  Audible Swallowing: A few with stimulation Intervention(s): Skin to skin;Hand expression  Type of Nipple: Everted at rest and after stimulation (swollen areola)  Comfort (Breast/Nipple): Soft / non-tender     Hold (Positioning): Assistance needed to correctly position infant at breast and maintain latch. Intervention(s): Breastfeeding basics reviewed;Support Pillows;Position options;Skin to skin  LATCH Score: 8  Lactation Tools Discussed/Used     Consult Status      Erika [name] 10/17/2015, 1:59 PM

## 2015-10-17 NOTE — Lactation Note (Signed)
This note was copied from a baby's chart. Lactation Consultation Note  Follow up visit made prior to discharge.  Mom states baby has been latching easily and nursing well.  Baby currently sleeping but waking techniques done and placed skin to skin for feeding assessment.  Mom attempting cradle hold but awkward.  Assisted with football hold.  Baby opened wide and latched easily.  Baby nursed actively with good swallows.  Reviewed basics and discharge instructions including engorgement treatment.  Lactation outpatient services and support information reviewed and encouraged.  Patient Name: Erika [name] [id number] Date: 10/17/2015 Reason for consult: Follow-up assessment   Maternal Data    Feeding Feeding Type: Breast Fed Length of feed: 10 min  LATCH Score/Interventions Latch: Grasps breast easily, tongue down, lips flanged, rhythmical sucking. Intervention(s): Adjust position;Assist with latch;Breast massage;Breast compression  Audible Swallowing: Spontaneous and intermittent Intervention(s): Alternate breast massage  Type of Nipple: Everted at rest and after stimulation  Comfort (Breast/Nipple): Soft / non-tender     Hold (Positioning): Assistance needed to correctly position infant at breast and maintain latch. Intervention(s): Breastfeeding basics reviewed;Support Pillows;Position options;Skin to skin  LATCH Score: 9  Lactation Tools Discussed/Used     Consult Status Consult Status: Complete    Ave Filter 10/17/2015, 3:37 PM

## 2015-12-24 ENCOUNTER — Other Ambulatory Visit: Payer: Self-pay | Admitting: Family Medicine

## 2015-12-24 NOTE — Telephone Encounter (Signed)
Last visit 01/12/15, please review-aa

## 2015-12-24 NOTE — Telephone Encounter (Signed)
LMTCB. Pt needs to schedule appt.

## 2015-12-24 NOTE — Telephone Encounter (Signed)
Pt informed. Appt scheduled for Wednesday at 10:30.

## 2015-12-24 NOTE — Telephone Encounter (Signed)
Appt always needed for adderall refill.

## 2015-12-24 NOTE — Telephone Encounter (Signed)
Pt needs a refill on Adderall 25 mg, states she stopped taking due to having a baby and would like a refill now.

## 2015-12-26 ENCOUNTER — Ambulatory Visit (INDEPENDENT_AMBULATORY_CARE_PROVIDER_SITE_OTHER): Payer: BLUE CROSS/BLUE SHIELD | Admitting: Family Medicine

## 2015-12-26 ENCOUNTER — Encounter: Payer: Self-pay | Admitting: Family Medicine

## 2015-12-26 VITALS — BP 112/60 | HR 88 | Temp 98.0°F | Resp 16 | Wt 163.0 lb

## 2015-12-26 DIAGNOSIS — F988 Other specified behavioral and emotional disorders with onset usually occurring in childhood and adolescence: Secondary | ICD-10-CM

## 2015-12-26 DIAGNOSIS — F909 Attention-deficit hyperactivity disorder, unspecified type: Secondary | ICD-10-CM

## 2015-12-26 MED ORDER — AMPHETAMINE-DEXTROAMPHET ER 25 MG PO CP24: 25.0000 mg | ORAL_CAPSULE | ORAL | Status: DC

## 2015-12-26 NOTE — Progress Notes (Signed)
Patient ID: Erika [name], female   DOB: 1981-05-12, 35 y.o.   MRN: [id number]    Subjective:  HPI ADD- pt is here today to get back on her ADD medication. (Adderall) She had a baby and has finished breast feeding and she had stopped it while doing so. She reports that she did well on  Adderall 25 mg every morning. No post patum depressive symptoms.    Prior to Admission medications   Medication Sig Start Date End Date Taking? Authorizing Provider  Prenatal Vit-Fe Fumarate-FA (PRENATAL MULTIVITAMIN) TABS tablet Take 1 tablet by mouth daily at 12 noon.   Yes Historical Provider, MD    Patient Active Problem List   Diagnosis Date Noted  . Gestational hypertension 10/15/2015  . ADD (attention deficit disorder) 01/22/2015  . 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 Asthma     Social History   Social History  . Marital Status: Married    Spouse Name: N/A  . Number of Children: N/A  . Years of Education: N/A   Occupational History  . Not on file.   Social History Main Topics  . Smoking status: Former Research scientist (life sciences)  . Smokeless tobacco: Not on file     Comment: in college occasionally, nothing heavy  . Alcohol Use: Yes     Comment: occaisional 1-2 drinks a week  . Drug Use: No  . Sexual Activity: Not on file   Other Topics Concern  . Not on file   Social History Narrative    No Known Allergies  Review of Systems  Constitutional: Negative.   HENT: Negative.   Eyes: Negative.   Respiratory: Negative.   Cardiovascular: Negative.   Gastrointestinal: Negative.   Genitourinary: Negative.   Musculoskeletal: Negative.   Skin: Negative.   Neurological: Negative.   Endo/Heme/Allergies: Negative.   Psychiatric/Behavioral: Negative.     There is no immunization history for the selected administration types on file for this patient. Objective:  BP 112/60 mmHg  Pulse 88  Temp(Src) 98 F (36.7 C) (Oral)  Resp 16  Wt 163 lb  (73.936 kg)  Physical Exam  Constitutional: She is oriented to person, place, and time and well-developed, well-nourished, and in no distress.  Eyes: Conjunctivae and EOM are normal. Pupils are equal, round, and reactive to light.  Neck: Normal range of motion. Neck supple.  Cardiovascular: Normal rate, regular rhythm, normal heart sounds and intact distal pulses.   Pulmonary/Chest: Effort normal and breath sounds normal.  Neurological: She is alert and oriented to person, place, and time. She has normal reflexes. Gait normal. GCS score is 15.  Skin: Skin is warm and dry.  Psychiatric: Mood, memory, affect and judgment normal.    Lab Results  Component Value Date   WBC 3.2* 10/17/2015   HGB 8.8* 10/17/2015   HCT 26.1* 10/17/2015   PLT 169 10/17/2015   GLUCOSE 99 10/15/2015    CMP     Component Value Date/Time   NA 136 10/15/2015 1719   K 3.8 10/15/2015 1719   CL 109 10/15/2015 1719   CO2 18* 10/15/2015 1719   GLUCOSE 99 10/15/2015 1719   BUN 9 10/15/2015 1719   CREATININE 0.66 10/15/2015 1719   CALCIUM 8.8* 10/15/2015 1719   PROT 6.3* 10/15/2015 1719   ALBUMIN 2.8* 10/15/2015 1719   AST 20 10/15/2015 1719   ALT 12* 10/15/2015 1719   ALKPHOS 156* 10/15/2015 1719   BILITOT 0.3 10/15/2015 1719  GFRNONAA >60 10/15/2015 1719   GFRAA >60 10/15/2015 1719    Assessment and Plan :  1. ADD (attention deficit disorder) Restart Adderall after pregnancy. Refills times 3 Patient advised to take as many days off from taking the Adderall as possible. She evidently has had a lot of counseling regarding this issue. May need to restart counseling in the future. Return to clinic 3-4 months or longer if she is able to make these refills last. - amphetamine-dextroamphetamine (ADDERALL XR) 25 MG 24 hr capsule; Take 1 capsule by mouth every morning.  Dispense: 30 capsule; Refill: 0  Patient was seen and examined by Dr. Miguel Aschoff, and noted scribed by Webb Laws, [facility] MD [facility] 12/26/2015 10:45 AM

## 2016-04-16 ENCOUNTER — Encounter: Payer: Self-pay | Admitting: Family Medicine

## 2016-04-16 ENCOUNTER — Ambulatory Visit (INDEPENDENT_AMBULATORY_CARE_PROVIDER_SITE_OTHER): Payer: BLUE CROSS/BLUE SHIELD | Admitting: Family Medicine

## 2016-04-16 VITALS — BP 102/68 | HR 80 | Temp 97.7°F | Resp 16 | Wt 150.0 lb

## 2016-04-16 DIAGNOSIS — F900 Attention-deficit hyperactivity disorder, predominantly inattentive type: Secondary | ICD-10-CM | POA: Diagnosis not present

## 2016-04-16 DIAGNOSIS — Z23 Encounter for immunization: Secondary | ICD-10-CM | POA: Diagnosis not present

## 2016-04-16 DIAGNOSIS — F988 Other specified behavioral and emotional disorders with onset usually occurring in childhood and adolescence: Secondary | ICD-10-CM

## 2016-04-16 MED ORDER — AMPHETAMINE-DEXTROAMPHET ER 25 MG PO CP24: 25.0000 mg | ORAL_CAPSULE | ORAL | 0 refills | Status: DC

## 2016-04-16 NOTE — Progress Notes (Signed)
       Patient: Erika [name] Female    DOB: 12-09-1980   35 y.o.   MRN: [id number] Visit Date: 04/16/2016  Today's Provider: Wilhemena Durie, MD   Chief Complaint  Patient presents with  . ADD   Subjective:    HPI     Follow up for ADD  The patient was last seen for this 4 months ago. Changes made at last visit include advising pt to start Adderall after pregnancy. Pt is not breast feeding.  Pt is here today for a refill of her Adderall. Pt reports her ADD is unchanged, and is well controlled on Adderall.   ------------------------------------------------------------------------------------     No Known Allergies   Current Outpatient Prescriptions:  .  Prenatal Vit-Fe Fumarate-FA (PRENATAL MULTIVITAMIN) TABS tablet, Take 1 tablet by mouth daily at 12 noon., Disp: , Rfl:  .  amphetamine-dextroamphetamine (ADDERALL XR) 25 MG 24 hr capsule, Take 1 capsule by mouth every morning. (Patient not taking: Reported on 04/16/2016), Disp: 30 capsule, Rfl: 0  Review of Systems  Constitutional: Positive for fatigue (due to having a 69 month old). Negative for activity change, appetite change, chills, diaphoresis, fever and unexpected weight change.  Respiratory: Negative.   Cardiovascular: Negative.   Gastrointestinal: Negative.   Endocrine: Negative.   Allergic/Immunologic: Negative.   Psychiatric/Behavioral: Negative for decreased concentration.    Social History  Substance Use Topics  . Smoking status: Former Research scientist (life sciences)  . Smokeless tobacco: Never Used     Comment: in college occasionally, nothing heavy  . Alcohol use Yes     Comment: occaisional 1-2 drinks a week   Objective:   BP 102/68 (BP Location: Right Arm, Patient Position: Sitting, Cuff Size: Normal)   Pulse 80   Temp 97.7 F (36.5 C) (Oral)   Resp 16   Wt 150 lb (68 kg)   LMP 04/09/2016   BMI 24.96 kg/m   Physical Exam  Constitutional: She appears well-developed and well-nourished.  HENT:  Head:  Normocephalic and atraumatic.  Neck: Normal range of motion.  Cardiovascular: Normal rate, regular rhythm and normal heart sounds.   Pulmonary/Chest: Effort normal and breath sounds normal. No respiratory distress.  Abdominal: Soft.  Skin: Skin is warm and dry.  Psychiatric: She has a normal mood and affect. Her behavior is normal. Judgment and thought content normal.        Assessment & Plan:     1. Attention deficit disorder, unspecified hyperactivity presence Stable. Not breast feeding baby. Will refill for 3 months. - amphetamine-dextroamphetamine (ADDERALL XR) 25 MG 24 hr capsule; Take 1 capsule by mouth every morning.  Dispense: 30 capsule; Refill: 0  2. Flu vaccine need Administered today. - Flu Vaccine QUAD 36+ mos IM     Patient seen and examined by Miguel Aschoff, MD, and note scribed by Renaldo Fiddler, CMA.  I have done the exam and reviewed the above chart and it is accurate to the best of my knowledge.  Audyn Dimercurio Cranford Mon, MD  [facility]

## 2017-03-12 IMAGING — US US OB TRANSVAGINAL
1 series · 14 of 28 positions shown · non-contrast
Comparison: None.

CLINICAL DATA: Examination to determine fetal viability.

EXAM:
OBSTETRIC <14 WK US AND TRANSVAGINAL OB US
TECHNIQUE: Both transabdominal and transvaginal ultrasound examinations were
performed for complete evaluation of the gestation as well as the
maternal uterus, adnexal regions, and pelvic cul-de-sac.
Transvaginal technique was performed to assess early pregnancy.

[Series 1: us ob transvaginal · 57 acquisitions, 14 frames shown]
[im 3/57]
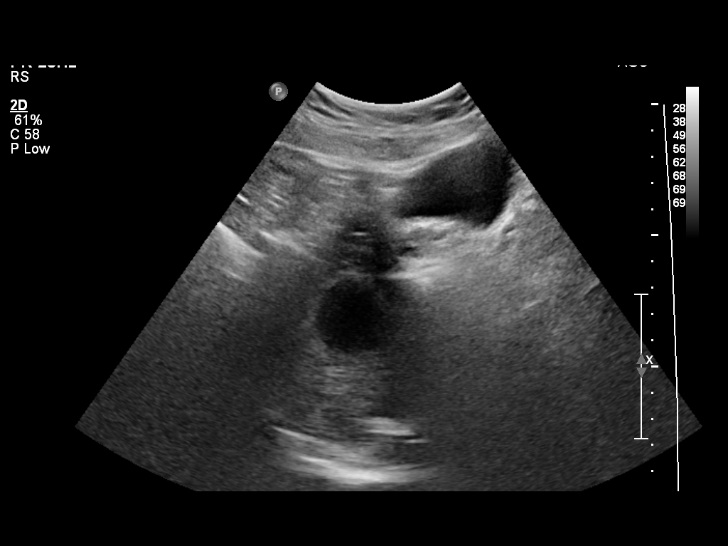
[im 7/57]
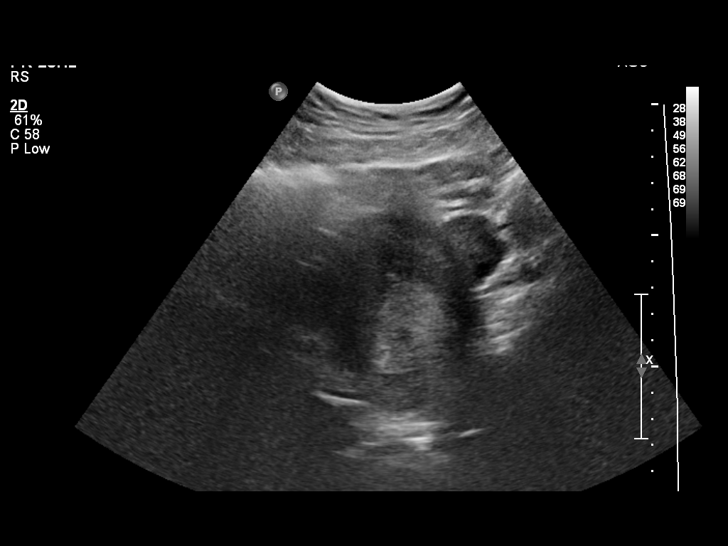
[im 11/57]
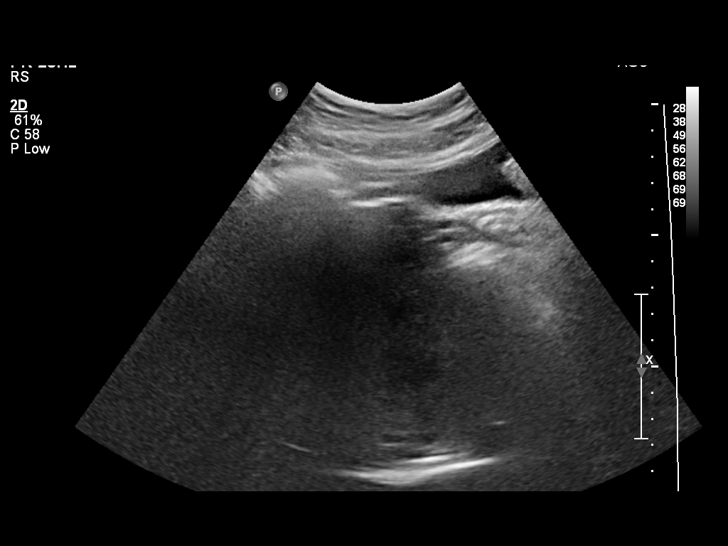
[im 15/57]
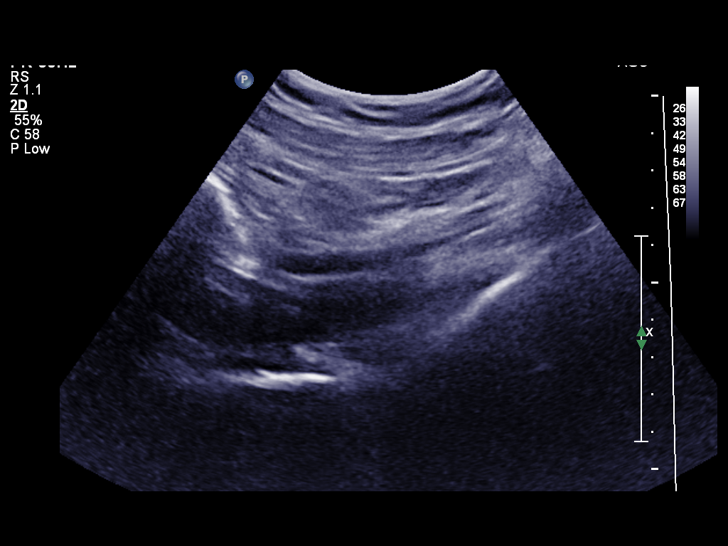
[im 19/57]
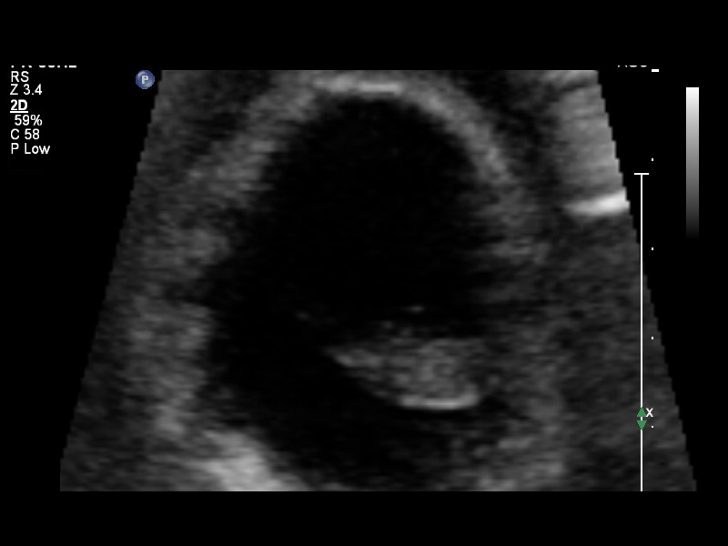
[im 23/57]
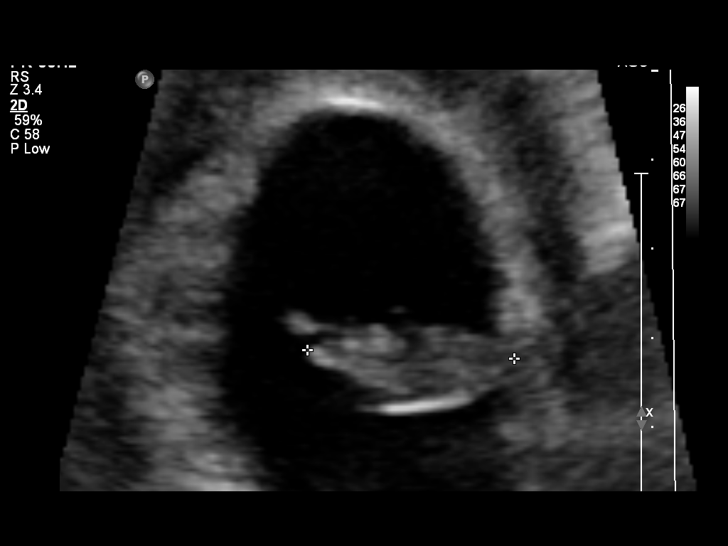
[im 27/57]
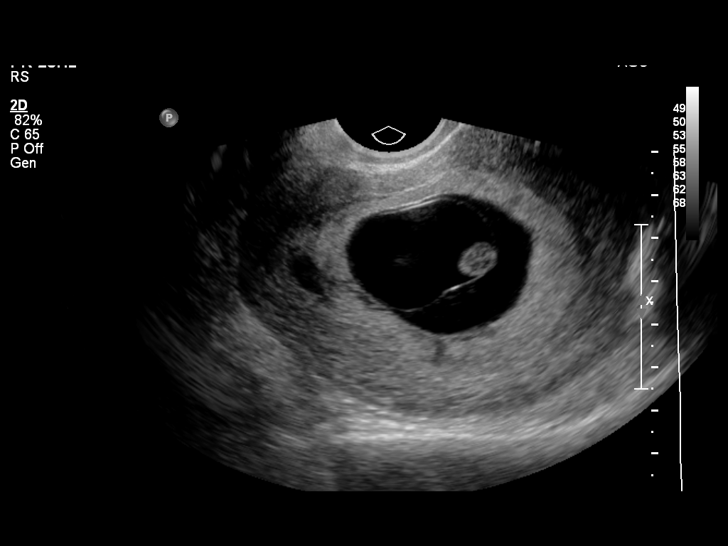
[im 32/57]
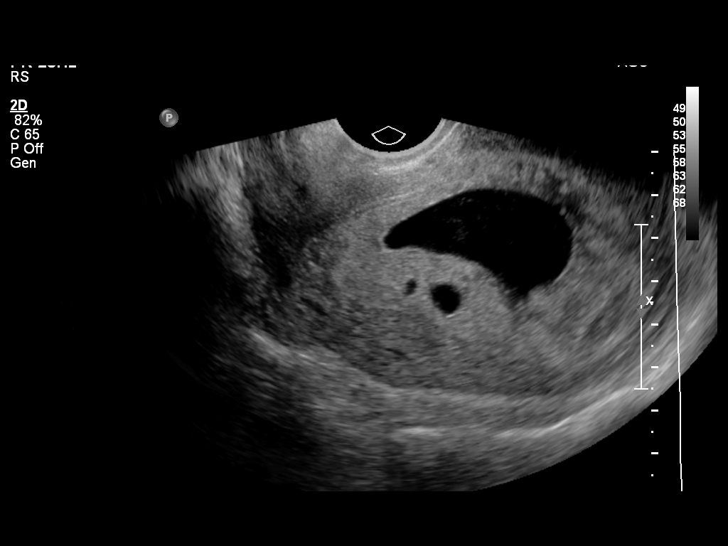
[im 36/57]
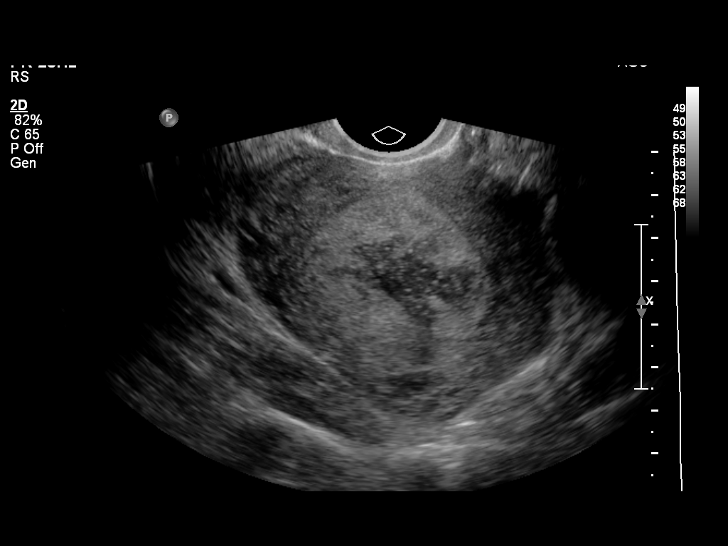
[im 40/57]
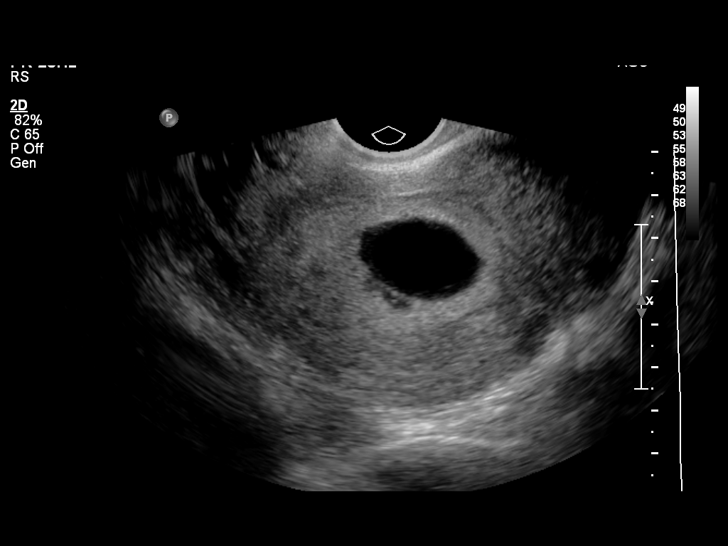
[im 44/57]
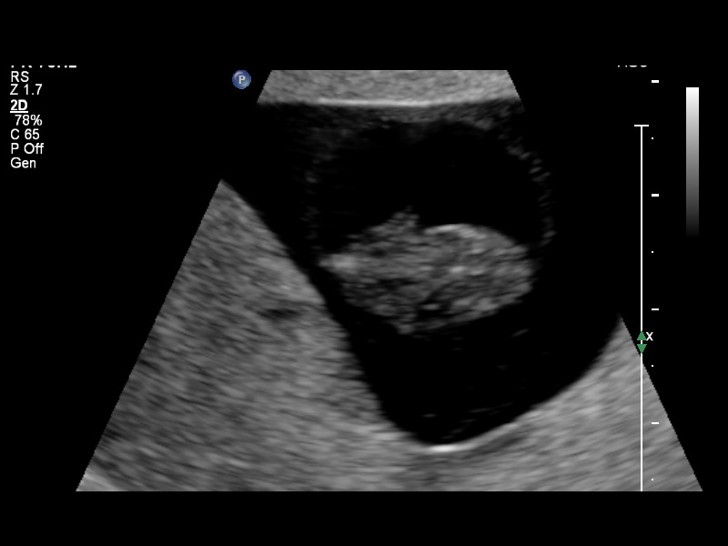
[im 48/57]
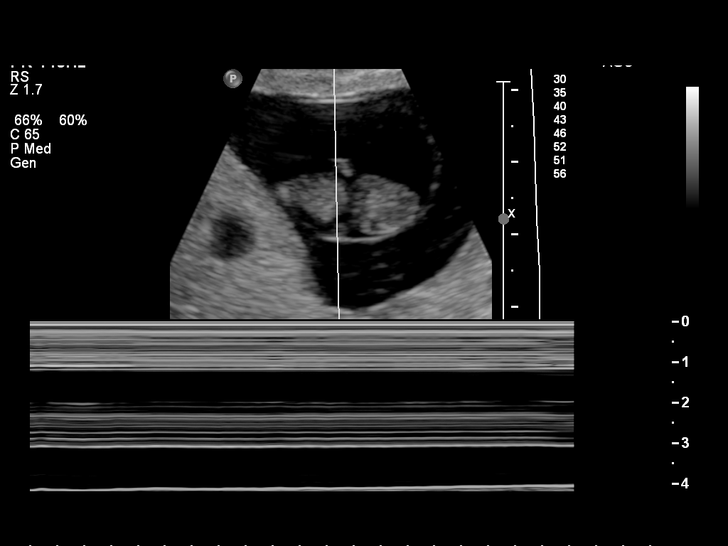
[im 52/57]
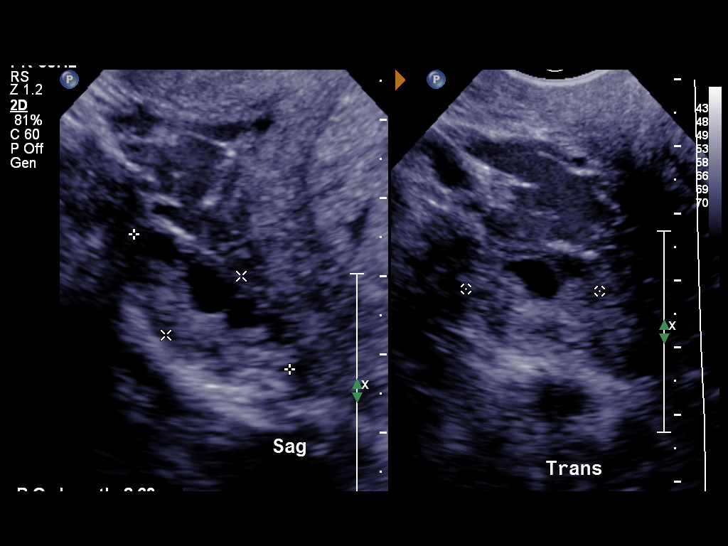
[im 57/57]
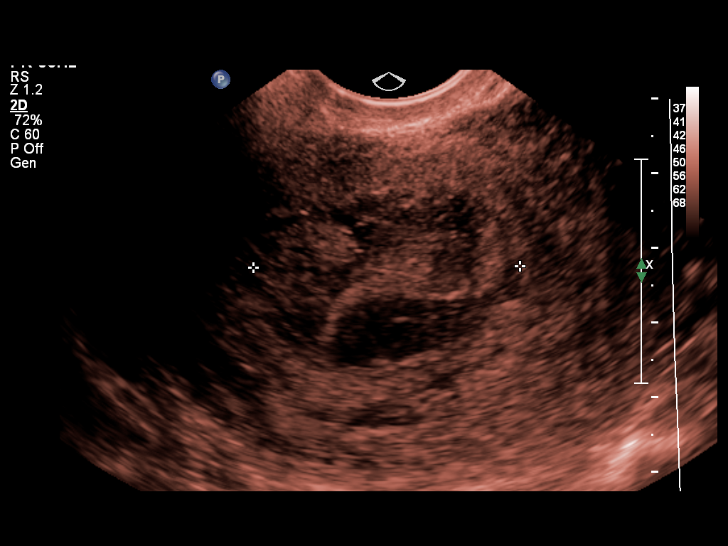

[14 of 28 positions shown; findings below may reference images not displayed]

FINDINGS: Intrauterine gestational sac: Single

Yolk sac:  No

Embryo:  Yes

Cardiac Activity: No

CRL:  20  mm   8 w   4 d

Maternal uterus/adnexae: There is a moderate subchorionic
hemorrhage. The ovaries are normal with a corpus luteum cyst on the
left. No free fluid.
IMPRESSION: Intrauterine fetal demise.  Subchorionic hemorrhage.

## 2017-03-31 LAB — OB RESULTS CONSOLE ANTIBODY SCREEN: Antibody Screen: NEGATIVE

## 2017-03-31 LAB — OB RESULTS CONSOLE HEPATITIS B SURFACE ANTIGEN: Hepatitis B Surface Ag: NEGATIVE

## 2017-03-31 LAB — OB RESULTS CONSOLE RPR: RPR: NONREACTIVE

## 2017-03-31 LAB — OB RESULTS CONSOLE GC/CHLAMYDIA
CHLAMYDIA, DNA PROBE: NEGATIVE
GC PROBE AMP, GENITAL: NEGATIVE

## 2017-03-31 LAB — OB RESULTS CONSOLE ABO/RH: RH Type: POSITIVE

## 2017-03-31 LAB — OB RESULTS CONSOLE RUBELLA ANTIBODY, IGM: RUBELLA: IMMUNE

## 2017-03-31 LAB — OB RESULTS CONSOLE HIV ANTIBODY (ROUTINE TESTING): HIV: NONREACTIVE

## 2017-04-06 LAB — HM PAP SMEAR

## 2017-07-07 NOTE — L&D Delivery Note (Signed)
I received a call from MAU at 0441 that the patient had arrived and was 9.5 cm.  RN delivered viable female at 0444 and per her report, without complication.  Nigel Berthold, CNM delivered the placenta and on my arrival reported heavier than normal vaginal bleeding and atony.  The patient had no IV and at that point had received IM pitocin only.  I ordered buccal cytotec and IM methergine and the uterine tone quickly improved.  Anesthesia:  Lidocaine to perineum Episiotomy:  none Lacerations:  2nd degree perineal  Suture Repair: 3.0 vicryl rapide Est. Blood Loss (mL):  450  Mom to postpartum.  Baby to Couplet care / Skin to Skin.  Hevin Jeffcoat 10/09/2017, 5:22 AM

## 2017-10-02 ENCOUNTER — Telehealth (HOSPITAL_COMMUNITY): Payer: Self-pay | Admitting: *Deleted

## 2017-10-02 ENCOUNTER — Encounter (HOSPITAL_COMMUNITY): Payer: Self-pay | Admitting: *Deleted

## 2017-10-02 NOTE — Telephone Encounter (Signed)
Preadmission screen  

## 2017-10-09 ENCOUNTER — Inpatient Hospital Stay (HOSPITAL_COMMUNITY)
Admission: RE | Admit: 2017-10-09 | Discharge: 2017-10-09 | Disposition: A | Discharge status: Home / Self Care | Payer: BLUE CROSS/BLUE SHIELD | Source: Ambulatory Visit | Attending: Obstetrics and Gynecology | Admitting: Obstetrics and Gynecology

## 2017-10-09 ENCOUNTER — Inpatient Hospital Stay (HOSPITAL_COMMUNITY)
Admission: AD | Admit: 2017-10-09 | Discharge: 2017-10-10 | DRG: 806 | Disposition: A | Discharge status: Home / Self Care | Payer: BLUE CROSS/BLUE SHIELD | Source: Ambulatory Visit | Attending: Obstetrics & Gynecology | Admitting: Obstetrics & Gynecology

## 2017-10-09 ENCOUNTER — Encounter (HOSPITAL_COMMUNITY): Payer: Self-pay

## 2017-10-09 DIAGNOSIS — O9962 Diseases of the digestive system complicating childbirth: Secondary | ICD-10-CM | POA: Diagnosis present

## 2017-10-09 DIAGNOSIS — O9912 Other diseases of the blood and blood-forming organs and certain disorders involving the immune mechanism complicating childbirth: Secondary | ICD-10-CM | POA: Diagnosis present

## 2017-10-09 DIAGNOSIS — Z3A39 39 weeks gestation of pregnancy: Secondary | ICD-10-CM

## 2017-10-09 DIAGNOSIS — Z87891 Personal history of nicotine dependence: Secondary | ICD-10-CM | POA: Diagnosis not present

## 2017-10-09 DIAGNOSIS — D72819 Decreased white blood cell count, unspecified: Secondary | ICD-10-CM | POA: Diagnosis present

## 2017-10-09 DIAGNOSIS — Z3490 Encounter for supervision of normal pregnancy, unspecified, unspecified trimester: Secondary | ICD-10-CM

## 2017-10-09 DIAGNOSIS — K529 Noninfective gastroenteritis and colitis, unspecified: Secondary | ICD-10-CM | POA: Diagnosis present

## 2017-10-09 LAB — CBC WITH DIFFERENTIAL/PLATELET
BASOS PCT: 2 %
Basophils Absolute: 0 10*3/uL (ref 0.0–0.1)
EOS PCT: 1 %
Eosinophils Absolute: 0 10*3/uL (ref 0.0–0.7)
HEMATOCRIT: 37.5 % (ref 36.0–46.0)
Hemoglobin: 13.2 g/dL (ref 12.0–15.0)
LYMPHS ABS: 0.5 10*3/uL — AB (ref 0.7–4.0)
Lymphocytes Relative: 28 %
MCH: 31.8 pg (ref 26.0–34.0)
MCHC: 35.2 g/dL (ref 30.0–36.0)
MCV: 90.4 fL (ref 78.0–100.0)
Monocytes Absolute: 0.2 10*3/uL (ref 0.1–1.0)
Monocytes Relative: 10 %
NEUTROS ABS: 1 10*3/uL — AB (ref 1.7–7.7)
Neutrophils Relative %: 59 %
OTHER: 0 %
Platelets: 197 10*3/uL (ref 150–400)
RBC: 4.15 MIL/uL (ref 3.87–5.11)
RDW: 14 % (ref 11.5–15.5)
WBC: 1.7 10*3/uL — ABNORMAL LOW (ref 4.0–10.5)

## 2017-10-09 LAB — COMPREHENSIVE METABOLIC PANEL
ALT: 14 U/L (ref 14–54)
AST: 34 U/L (ref 15–41)
Albumin: 2.8 g/dL — ABNORMAL LOW (ref 3.5–5.0)
Alkaline Phosphatase: 120 U/L (ref 38–126)
Anion gap: 14 (ref 5–15)
BILIRUBIN TOTAL: 0.9 mg/dL (ref 0.3–1.2)
BUN: 11 mg/dL (ref 6–20)
CO2: 17 mmol/L — ABNORMAL LOW (ref 22–32)
Calcium: 8.2 mg/dL — ABNORMAL LOW (ref 8.9–10.3)
Chloride: 98 mmol/L — ABNORMAL LOW (ref 101–111)
Creatinine, Ser: 0.64 mg/dL (ref 0.44–1.00)
GFR calc Af Amer: >60 mL/min (ref >60)
Glucose, Bld: 121 mg/dL — ABNORMAL HIGH (ref 65–99)
POTASSIUM: 3.1 mmol/L — AB (ref 3.5–5.1)
Sodium: 129 mmol/L — ABNORMAL LOW (ref 135–145)
TOTAL PROTEIN: 6.5 g/dL (ref 6.5–8.1)

## 2017-10-09 MED ORDER — LACTATED RINGERS IV SOLN: 500.0000 mL | INTRAVENOUS | Status: DC | PRN

## 2017-10-09 MED ORDER — ZOLPIDEM TARTRATE 5 MG PO TABS: 5.0000 mg | ORAL_TABLET | Freq: Every evening | ORAL | Status: DC | PRN

## 2017-10-09 MED ORDER — OXYTOCIN 10 UNIT/ML IJ SOLN
INTRAMUSCULAR | Status: AC
  Administered 2017-10-09: 10 [IU]
  Filled 2017-10-09: qty 1

## 2017-10-09 MED ORDER — SOD CITRATE-CITRIC ACID 500-334 MG/5ML PO SOLN: 30.0000 mL | ORAL | Status: DC | PRN

## 2017-10-09 MED ORDER — COCONUT OIL OIL: 1.0000 "application " | TOPICAL_OIL | Status: DC | PRN

## 2017-10-09 MED ORDER — ONDANSETRON HCL 4 MG/2ML IJ SOLN: 4.0000 mg | Freq: Four times a day (QID) | INTRAMUSCULAR | Status: DC | PRN

## 2017-10-09 MED ORDER — ACETAMINOPHEN 325 MG PO TABS: 650.0000 mg | ORAL_TABLET | ORAL | Status: DC | PRN

## 2017-10-09 MED ORDER — ACETAMINOPHEN 325 MG PO TABS
650.0000 mg | ORAL_TABLET | ORAL | Status: DC | PRN
  Administered 2017-10-09: 650 mg via ORAL
  Filled 2017-10-09: qty 2

## 2017-10-09 MED ORDER — PRENATAL MULTIVITAMIN CH
1.0000 | ORAL_TABLET | Freq: Every day | ORAL | Status: DC
  Administered 2017-10-09 – 2017-10-10 (×2): 1 via ORAL
  Filled 2017-10-09 (×2): qty 1

## 2017-10-09 MED ORDER — OXYCODONE-ACETAMINOPHEN 5-325 MG PO TABS: 1.0000 | ORAL_TABLET | ORAL | Status: DC | PRN

## 2017-10-09 MED ORDER — ONDANSETRON HCL 4 MG PO TABS
4.0000 mg | ORAL_TABLET | ORAL | Status: DC | PRN
  Administered 2017-10-09: 4 mg via ORAL
  Filled 2017-10-09: qty 1

## 2017-10-09 MED ORDER — OXYCODONE-ACETAMINOPHEN 5-325 MG PO TABS: 2.0000 | ORAL_TABLET | ORAL | Status: DC | PRN

## 2017-10-09 MED ORDER — LIDOCAINE HCL (PF) 1 % IJ SOLN
30.0000 mL | INTRAMUSCULAR | Status: DC | PRN
  Administered 2017-10-09: 30 mL via SUBCUTANEOUS
  Filled 2017-10-09: qty 30

## 2017-10-09 MED ORDER — TETANUS-DIPHTH-ACELL PERTUSSIS 5-2.5-18.5 LF-MCG/0.5 IM SUSP: 0.5000 mL | Freq: Once | INTRAMUSCULAR | Status: DC

## 2017-10-09 MED ORDER — OXYTOCIN 10 UNIT/ML IJ SOLN: 10.0000 [IU] | Freq: Once | INTRAMUSCULAR | Status: DC

## 2017-10-09 MED ORDER — MISOPROSTOL 200 MCG PO TABS
1000.0000 ug | ORAL_TABLET | Freq: Once | ORAL | Status: AC
  Administered 2017-10-09: 1000 ug via ORAL

## 2017-10-09 MED ORDER — BUTORPHANOL TARTRATE 1 MG/ML IJ SOLN: 1.0000 mg | INTRAMUSCULAR | Status: DC | PRN

## 2017-10-09 MED ORDER — METHYLERGONOVINE MALEATE 0.2 MG/ML IJ SOLN
0.2000 mg | Freq: Once | INTRAMUSCULAR | Status: AC
  Administered 2017-10-09: 0.2 mg via INTRAMUSCULAR

## 2017-10-09 MED ORDER — LIDOCAINE HCL (PF) 1 % IJ SOLN
INTRAMUSCULAR | Status: AC
  Filled 2017-10-09: qty 30

## 2017-10-09 MED ORDER — DIPHENOXYLATE-ATROPINE 2.5-0.025 MG PO TABS
1.0000 | ORAL_TABLET | Freq: Four times a day (QID) | ORAL | Status: DC | PRN
  Administered 2017-10-09: 1 via ORAL
  Filled 2017-10-09: qty 1

## 2017-10-09 MED ORDER — MISOPROSTOL 200 MCG PO TABS
ORAL_TABLET | ORAL | Status: AC
  Filled 2017-10-09: qty 4

## 2017-10-09 MED ORDER — BENZOCAINE-MENTHOL 20-0.5 % EX AERO
1.0000 "application " | INHALATION_SPRAY | CUTANEOUS | Status: DC | PRN
  Administered 2017-10-09: 1 via TOPICAL
  Filled 2017-10-09: qty 56

## 2017-10-09 MED ORDER — LACTATED RINGERS IV SOLN: INTRAVENOUS | Status: DC

## 2017-10-09 MED ORDER — IBUPROFEN 600 MG PO TABS
600.0000 mg | ORAL_TABLET | Freq: Four times a day (QID) | ORAL | Status: DC
  Administered 2017-10-09 – 2017-10-10 (×6): 600 mg via ORAL
  Filled 2017-10-09 (×6): qty 1

## 2017-10-09 MED ORDER — ONDANSETRON HCL 4 MG/2ML IJ SOLN: 4.0000 mg | INTRAMUSCULAR | Status: DC | PRN

## 2017-10-09 MED ORDER — OXYTOCIN BOLUS FROM INFUSION: 500.0000 mL | Freq: Once | INTRAVENOUS | Status: DC

## 2017-10-09 MED ORDER — WITCH HAZEL-GLYCERIN EX PADS
1.0000 "application " | MEDICATED_PAD | CUTANEOUS | Status: DC | PRN
  Administered 2017-10-09: 1 via TOPICAL

## 2017-10-09 MED ORDER — DIPHENHYDRAMINE HCL 25 MG PO CAPS: 25.0000 mg | ORAL_CAPSULE | Freq: Four times a day (QID) | ORAL | Status: DC | PRN

## 2017-10-09 MED ORDER — DIBUCAINE 1 % RE OINT: 1.0000 "application " | TOPICAL_OINTMENT | RECTAL | Status: DC | PRN

## 2017-10-09 MED ORDER — LACTATED RINGERS IV SOLN
INTRAVENOUS | Status: DC
  Administered 2017-10-09 – 2017-10-10 (×3): via INTRAVENOUS

## 2017-10-09 MED ORDER — CEFOTETAN DISODIUM 1 G IJ SOLR
1.0000 g | Freq: Two times a day (BID) | INTRAMUSCULAR | Status: DC
  Administered 2017-10-09 – 2017-10-10 (×3): 1 g via INTRAVENOUS
  Filled 2017-10-09 (×4): qty 1

## 2017-10-09 MED ORDER — SIMETHICONE 80 MG PO CHEW: 80.0000 mg | CHEWABLE_TABLET | ORAL | Status: DC | PRN

## 2017-10-09 MED ORDER — OXYTOCIN 40 UNITS IN LACTATED RINGERS INFUSION - SIMPLE MED: 2.5000 [IU]/h | INTRAVENOUS | Status: DC

## 2017-10-09 MED ORDER — LACTATED RINGERS IV BOLUS
500.0000 mL | Freq: Once | INTRAVENOUS | Status: AC
  Administered 2017-10-09: 500 mL via INTRAVENOUS

## 2017-10-09 MED ORDER — SENNOSIDES-DOCUSATE SODIUM 8.6-50 MG PO TABS: 2.0000 | ORAL_TABLET | ORAL | Status: DC

## 2017-10-09 NOTE — Progress Notes (Signed)
Post Partum Day 0 Subjective: Pt has had several episodes of diarrhea beginning yesteday before her delivery.  Today having chillls  Objective: Blood pressure (!) 128/56, pulse (!) 104, temperature (!) 102.5 F (39.2 C), temperature source Oral, resp. rate 20, last menstrual period 01/09/2017, SpO2 99 %, unknown if currently breastfeeding.  Physical Exam:  General: alert, cooperative, appears stated age, flushed and no distress Lochia: appropriate Uterine Fundus: firm Incision: healing well DVT Evaluation: No evidence of DVT seen on physical exam.  No results for input(s): HGB, HCT in the last 72 hours.  Assessment/Plan: Breastfeeding  Febrile - will check cbc and cmet.  GBS was neg and quick labor. NSAIDs and tylenol and po fluids (no iv)   LOS: 0 days   Johnedward Brodrick C 10/09/2017, 9:43 AM

## 2017-10-09 NOTE — Progress Notes (Signed)
Dr Corinna Capra notified of temperature 102.56F and of episode of diarrhea.  Pt reports she had diarrhea since yesterday, prior to going into labor.  Pt reports some nausea and reports that she vomited prior to delivery.  Pt denies any cold symptoms.  Pt denies abdominal pain.  Pt given Tylenol, Ibuprofen, and Zofran.  Will recheck temperature.

## 2017-10-09 NOTE — Lactation Note (Signed)
This note was copied from a baby's chart. Lactation Consultation Note  Patient Name: Erika [name] [id number] Date: 10/09/2017 Reason for consult: Initial assessment;Term  33 hours old FT female who is being exclusively BF by her mother, she's a P2. Mom has some experience BF, she BF her older child for 6 weeks but per mom she had to supplement with formula after leaving the hospital because her baby had weight loss.  Baby started cueing when entering the room, when [facility] got her ready for STS, she had a vowel movement, LC changed baby's diaper. Infant was able to latch after several attempts in the football position on the right breast, LC had to so some suck training. Infant would fall asleep but kept cueing and waking up, until finally she latched on correctly. Baby was still nursing when entering the room.  Taught mom how to hand express, and she was able to get a few drops of colostrum, mom was very pleased, she feared a low milk supply experience with this baby as well. Explained to mom that every pregnancy and every BF experience is different.  Encouraged mom to feed baby on cues STS 8-12 times/24 hours. Reviewed BF brochure, BF resources and feeding diary. Mom is aware of [place] services and will call PRN.  Maternal Data Formula Feeding for Exclusion: No Has patient been taught Hand Expression?: Yes Does the patient have breastfeeding experience prior to this delivery?: Yes  Feeding Feeding Type: Breast Fed Length of feed: 5 min(Baby still nursing when exiting the room)  LATCH Score Latch: Repeated attempts needed to sustain latch, nipple held in mouth throughout feeding, stimulation needed to elicit sucking reflex.  Audible Swallowing: A few with stimulation  Type of Nipple: Everted at rest and after stimulation  Comfort (Breast/Nipple): Soft / non-tender  Hold (Positioning): Assistance needed to correctly position infant at breast and maintain latch.  LATCH Score:  7  Interventions Interventions: Breast feeding basics reviewed;Assisted with latch;Skin to skin;Breast massage;Hand express;Breast compression;Adjust position;Support pillows;Position options  Lactation Tools Discussed/Used WIC Program: No   Consult Status Consult Status: Follow-up Date: 10/10/17 Follow-up type: In-patient    Shaquan Puerta Francene Boyers 10/09/2017, 5:11 PM

## 2017-10-09 NOTE — Progress Notes (Signed)
Type and screen order placed on admission was canceled on transfer to [facility].  Dr. Corinna Capra notified and does not want it reordered at this time

## 2017-10-09 NOTE — H&P (Signed)
Erika [name] is a 37 y.o. female presenting for labor.  Patient reports CTX started at 3am.  CVX 9.5 cm in MAU.  Antepartum course uncomplicated.  Patient has balanced translocation (10/11).  GBS negative.  OB History    Gravida  5   Para  1   Term  1   Preterm      AB  3   Living  1     SAB  3   TAB      Ectopic      Multiple  0   Live Births  1          Past Medical History:  Diagnosis Date  . Asthma   . 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 DILATION AND CURETTAGE OF UTERUS  2016   from miscarriage  . DILATION AND EVACUATION N/A 10/24/2014   Procedure: DILATATION AND EVACUATION with genetic studies;  Surgeon: Louretta Shorten, MD;  Location: [facility];  Service: Gynecology;  Laterality: N/A;  . WISDOM TOOTH EXTRACTION Left    Family History: family history includes Breast cancer in her maternal aunt; Diabetes in her father; Heart disease in her father; Leukemia in her sister. Social History:  reports that she has quit smoking. She has never used smokeless tobacco. She reports that she drinks alcohol. She reports that she does not use drugs.     Maternal Diabetes: No Genetic Screening: Normal Maternal Ultrasounds/Referrals: Normal Fetal Ultrasounds or other Referrals:  None Maternal Substance Abuse:  No Significant Maternal Medications:  None Significant Maternal Lab Results:  Lab values include: Group B Strep negative Other Comments:  None  ROS Maternal Medical History:  Reason for admission: Contractions.   Contractions: Onset was 1-2 hours ago.   Frequency: regular.   Perceived severity is moderate.    Fetal activity: Perceived fetal activity is normal.   Last perceived fetal movement was within the past hour.    Prenatal complications: no prenatal complications Prenatal Complications - Diabetes: none.    Dilation: 10 Effacement (%):  100 Station: Crowning Exam by:: Kermit Balo RN  Blood pressure (!) 132/93, pulse (!) 104, temperature 98.1 F (36.7 C), resp. rate 18, last menstrual period 01/09/2017, unknown if currently breastfeeding. Maternal Exam:  Abdomen: Patient reports no abdominal tenderness. Introitus: Normal vulva.   Physical Exam  Constitutional: She is oriented to person, place, and time. She appears well-developed and well-nourished.  GI: Soft. There is no rebound.  Neurological: She is alert and oriented to person, place, and time.  Skin: Skin is warm and dry.  Psychiatric: She has a normal mood and affect. Her behavior is normal.    Prenatal labs: ABO, Rh: A/Positive/-- (09/25 0000) Antibody: Negative (09/25 0000) Rubella: Immune (09/25 0000) RPR: Nonreactive (09/25 0000)  HBsAg: Negative (09/25 0000)  HIV: Non-reactive (09/25 0000)  GBS:     Assessment/Plan: 00XF [id number] s/p precipitous delivery at 39 weeks -See delivery note for details -Routine pp care    [place], [place] 10/09/2017, 5:55 AM

## 2017-10-09 NOTE — Progress Notes (Signed)
Post Partum Day 0 Subjective: Pt reports feelng better.  No diarrhea since last seen.  HX of leukpenia in past with normal hemeatology workup  Objective: Blood pressure 106/72, pulse (!) 114, temperature 98.6 F (37 C), temperature source Oral, resp. rate 18, last menstrual period 01/09/2017, SpO2 96 %, unknown if currently breastfeeding.  Physical Exam:  General: alert, cooperative, appears stated age and no distress Lochia: appropriate Uterine Fundus: firm Incision: healing well DVT Evaluation: No evidence of DVT seen on physical exam.  Recent Labs    10/09/17 1011  HGB 13.2  HCT 37.5    Assessment/Plan: PP febrile episode and leukopenia Discussed care with Dr Alvy Bimler (hematologist on call) Since patient is clinically improving, will give IVF and cover with IV abx for common PP causes of fever.  IV cefotetan. If not improving consider meds to improve WBC.   LOS: 0 days   Erika [name] C 10/09/2017, 2:13 PM

## 2017-10-10 LAB — COMPREHENSIVE METABOLIC PANEL
ALK PHOS: 96 U/L (ref 38–126)
ALT: 13 U/L — ABNORMAL LOW (ref 14–54)
AST: 25 U/L (ref 15–41)
Albumin: 2.2 g/dL — ABNORMAL LOW (ref 3.5–5.0)
Anion gap: 10 (ref 5–15)
BILIRUBIN TOTAL: 0.6 mg/dL (ref 0.3–1.2)
BUN: 9 mg/dL (ref 6–20)
CALCIUM: 7.5 mg/dL — AB (ref 8.9–10.3)
CO2: 22 mmol/L (ref 22–32)
CREATININE: 0.65 mg/dL (ref 0.44–1.00)
Chloride: 102 mmol/L (ref 101–111)
Glucose, Bld: 93 mg/dL (ref 65–99)
Potassium: 3 mmol/L — ABNORMAL LOW (ref 3.5–5.1)
Sodium: 134 mmol/L — ABNORMAL LOW (ref 135–145)
TOTAL PROTEIN: 5.2 g/dL — AB (ref 6.5–8.1)

## 2017-10-10 LAB — CBC WITH DIFFERENTIAL/PLATELET
BASOS PCT: 1 %
Basophils Absolute: 0 10*3/uL (ref 0.0–0.1)
Eosinophils Absolute: 0.2 10*3/uL (ref 0.0–0.7)
Eosinophils Relative: 8 %
HEMATOCRIT: 32 % — AB (ref 36.0–46.0)
HEMOGLOBIN: 11.3 g/dL — AB (ref 12.0–15.0)
LYMPHS PCT: 47 %
Lymphs Abs: 1.2 10*3/uL (ref 0.7–4.0)
MCH: 32.3 pg (ref 26.0–34.0)
MCHC: 35.3 g/dL (ref 30.0–36.0)
MCV: 91.4 fL (ref 78.0–100.0)
MONO ABS: 0.5 10*3/uL (ref 0.1–1.0)
Monocytes Relative: 19 %
Neutro Abs: 0.7 10*3/uL — ABNORMAL LOW (ref 1.7–7.7)
Neutrophils Relative %: 25 %
OTHER: 0 %
PLATELETS: 150 10*3/uL (ref 150–400)
RBC: 3.5 MIL/uL — ABNORMAL LOW (ref 3.87–5.11)
RDW: 14.4 % (ref 11.5–15.5)
WBC MORPHOLOGY: INCREASED
WBC: 2.6 10*3/uL — ABNORMAL LOW (ref 4.0–10.5)

## 2017-10-10 LAB — RPR: RPR Ser Ql: NONREACTIVE

## 2017-10-10 NOTE — Lactation Note (Signed)
This note was copied from a baby's chart. Lactation Consultation Note: Possible early DC this afternoon. Asked by Ped to see latch. Baby last fed about one hour ago. Mom easily able to hand express Colostrum. Baby took a few sucks then off to sleep. Encouraged mom to page when baby wakes for next feeding. Reviewed engorgement prevention and treatment. No questions at present. To call when baby wakes for next feeding  Patient Name: Erika [name] [id number] Date: 10/10/2017 Reason for consult: Follow-up assessment   Maternal Data Formula Feeding for Exclusion: No Has patient been taught Hand Expression?: Yes Does the patient have breastfeeding experience prior to this delivery?: Yes  Feeding Feeding Type: Breast Fed  LATCH Score Latch: Too sleepy or reluctant, no latch achieved, no sucking elicited.(few sucks)  Audible Swallowing: None  Type of Nipple: Everted at rest and after stimulation  Comfort (Breast/Nipple): Soft / non-tender  Hold (Positioning): Assistance needed to correctly position infant at breast and maintain latch.  LATCH Score: 5  Interventions Interventions: Breast feeding basics reviewed;Assisted with latch;Hand express  Lactation Tools Discussed/Used WIC Program: No   Consult Status Consult Status: Follow-up Date: 10/10/17 Follow-up type: In-patient    Truddie Crumble 10/10/2017, 10:43 AM

## 2017-10-10 NOTE — Discharge Summary (Signed)
Obstetric Discharge Summary Reason for Admission: onset of labor Prenatal Procedures: none Intrapartum Procedures: spontaneous vaginal delivery Postpartum Procedures: antibiotics Complications-Operative and Postpartum: febrile episode/leukopenia Hemoglobin  Date Value Ref Range Status  10/10/2017 11.3 (L) 12.0 - 15.0 g/dL Final   HCT  Date Value Ref Range Status  10/10/2017 32.0 (L) 36.0 - 46.0 % Final    Physical Exam:  General: alert, cooperative, appears stated age and no distress Lochia: appropriate Uterine Fundus: firm Incision: healing well DVT Evaluation: No evidence of DVT seen on physical exam.  Discharge Diagnoses: Term Pregnancy-delivered and postpartum febrile episode, Gastroenteritis, leukopenia  Discharge Information: Date: 10/10/2017 Activity: pelvic rest Diet: routine Medications: None Condition: stable Instructions: refer to practice specific booklet Discharge to: home   Newborn Data: Live born female  Birth Weight: 8 lb 5.7 oz (3790 g) APGAR: 9, 9  Newborn Delivery   Birth date/time:  10/09/2017 04:44:00 Delivery type:  Vaginal, Spontaneous     Home with mother.  Arav Bannister C 10/10/2017, 10:07 AM

## 2017-10-10 NOTE — Progress Notes (Signed)
Post Partum Day 1 Subjective: no complaints, up ad lib, voiding, tolerating PO and + flatus No more diarrhea, no fever, desires d.c Objective: Blood pressure 106/66, pulse 72, temperature 97.6 F (36.4 C), temperature source Oral, resp. rate 18, last menstrual period 01/09/2017, SpO2 96 %, unknown if currently breastfeeding.  Physical Exam:  General: alert, cooperative, appears stated age and no distress Lochia: appropriate Uterine Fundus: firm Incision: healing well DVT Evaluation: No evidence of DVT seen on physical exam.  Recent Labs    10/09/17 1011 10/10/17 0519  HGB 13.2 11.3*  HCT 37.5 32.0*    Assessment/Plan: Discharge home and Breastfeeding  WBC improved back to patients normal Will DC abx and dc home with precautions and fu in office   LOS: 1 day   Jerimiah Wolman C 10/10/2017, 10:09 AM

## 2017-10-10 NOTE — Progress Notes (Signed)
Mother of baby was referred for history of depression and anxiety. Referral screened out by CSW because per chart review, patient has no documented history of anxiety or depression. Per last delivery in 2017, patient did not experience symptoms of postpartum anxiety or depression.   Please contact CSW if mother of baby requests, if needs arise, or if mother of baby scores greater than a nine or answers yes to question ten on [place] Postpartum Depression Screen.   Madilyn Fireman, MSW, [place] Social Worker [facility] [phone]

## 2017-10-10 NOTE — Lactation Note (Signed)
This note was copied from a baby's chart. Lactation Consultation Note  Patient Name: Erika [name] [id number] Date: 10/10/2017 Reason for consult: Initial assessment;Infant weight loss;Term  Baby is 63 hours old  2nd [place] visit for today  [place] reviewed and updated the doc flow sheets - per mom  Baby last fed at 1030 am for 30 mins, with swallows Baby sluggish at 1st, LC changed a wet diaper, placed baby STS, Baby latched with flanged lips, and depth, few swallows - fed for 5 mins.  Baby released, burped and more awake, latched for 6 mins , more swallows.  After feeding LC reviewed hand expressing, several drops of transitional milk noted.  Nipple well rounded when abby released.  Due to mom hx of low milk supply , LC recommended until milk comes in to post pump Both breast for 10 - 15 mins to enhance volume.  Also reviewed engorgement prevention and tx. Mom will be seeing [facility] - recommended checking with office for [place] [facility], If unable to make appt, in 5 -7 days call [facility] clinic for appt.    Maternal Data Formula Feeding for Exclusion: No Has patient been taught Hand Expression?: Yes Does the patient have breastfeeding experience prior to this delivery?: Yes  Feeding Feeding Type: Breast Fed Length of feed: (few swallows)  LATCH Score Latch: Grasps breast easily, tongue down, lips flanged, rhythmical sucking.  Audible Swallowing: Spontaneous and intermittent  Type of Nipple: Everted at rest and after stimulation  Comfort (Breast/Nipple): Soft / non-tender  Hold (Positioning): Assistance needed to correctly position infant at breast and maintain latch.  LATCH Score: 9  Interventions Interventions: Breast feeding basics reviewed;Assisted with latch;Skin to skin;Breast massage;Hand express;Breast compression;Adjust position;Support pillows  Lactation Tools Discussed/Used WIC Program: No   Consult Status Consult Status: Complete Date: 10/10/17 Follow-up type:  In-patient    [place] 10/10/2017, 12:56 PM

## 2017-10-13 LAB — PATHOLOGIST SMEAR REVIEW

## 2019-01-31 ENCOUNTER — Other Ambulatory Visit: Payer: Self-pay

## 2019-01-31 ENCOUNTER — Encounter: Payer: Self-pay | Admitting: Family Medicine

## 2019-01-31 ENCOUNTER — Ambulatory Visit (INDEPENDENT_AMBULATORY_CARE_PROVIDER_SITE_OTHER): Payer: BLUE CROSS/BLUE SHIELD | Admitting: Family Medicine

## 2019-01-31 VITALS — BP 120/68 | HR 100 | Temp 98.0°F | Resp 16 | Wt 158.0 lb

## 2019-01-31 DIAGNOSIS — M25512 Pain in left shoulder: Secondary | ICD-10-CM

## 2019-01-31 DIAGNOSIS — R0789 Other chest pain: Secondary | ICD-10-CM

## 2019-01-31 NOTE — Progress Notes (Signed)
Patient: Erika [name] Female    DOB: 23-Apr-1981   38 y.o.   MRN: [id number] Visit Date: 01/31/2019  Today's Provider: Wilhemena Durie, MD   Chief Complaint  Patient presents with  . Shoulder Pain   Subjective:    Shoulder Pain  The pain is present in the left shoulder. This is a new problem. The current episode started yesterday. The problem occurs constantly. The problem has been gradually worsening. The pain is at a severity of 5/10. The pain is moderate (was severe last night). Associated symptoms include a limited range of motion. The symptoms are aggravated by activity. She has tried nothing for the symptoms.  The discomfort is actually left upper chest wall and she says it hurt to take deep breaths last night.No cough or SOB. No leg swelling.  It seems to be better today. She has had no trauma and she has no exertional discomfort or pain with movement of her shoulder She is not on birth control. Menses now. She now has 2 daughters. No Known Allergies   Current Outpatient Medications:  .  calcium carbonate (TUMS - DOSED IN MG ELEMENTAL CALCIUM) 500 MG chewable tablet, Chew 1-2 tablets by mouth daily., Disp: , Rfl:  .  Prenatal Vit-Fe Fumarate-FA (PRENATAL MULTIVITAMIN) TABS tablet, Take 1 tablet by mouth daily at 12 noon., Disp: , Rfl:   Review of Systems  Constitutional: Negative.   HENT: Negative.   Eyes: Negative.   Gastrointestinal: Negative.   Endocrine: Negative.   Genitourinary: Negative.   Musculoskeletal: Negative.   Neurological: Negative.   Psychiatric/Behavioral: Negative.     Social History   Tobacco Use  . Smoking status: Former Research scientist (life sciences)  . Smokeless tobacco: Never Used  . Tobacco comment: in college occasionally, nothing heavy  Substance Use Topics  . Alcohol use: Yes    Comment: occaisional 1-2 drinks a week      Objective:   BP 120/68   Pulse 100   Temp 98 F (36.7 C)   Resp 16   Wt 158 lb (71.7 kg)   SpO2 97%   BMI 26.29 kg/m   Vitals:   01/31/19 1452  BP: 120/68  Pulse: 100  Resp: 16  Temp: 98 F (36.7 C)  SpO2: 97%  Weight: 158 lb (71.7 kg)     Physical Exam Constitutional:      Appearance: Normal appearance.  HENT:     Head: Atraumatic.     Right Ear: External ear normal.     Left Ear: External ear normal.  Eyes:     General: No scleral icterus.    Conjunctiva/sclera: Conjunctivae normal.  Cardiovascular:     Rate and Rhythm: Normal rate and regular rhythm.     Pulses: Normal pulses.     Heart sounds: Normal heart sounds.  Pulmonary:     Effort: Pulmonary effort is normal.     Breath sounds: Normal breath sounds.  Abdominal:     Palpations: Abdomen is soft.  Musculoskeletal:        General: No tenderness.     Comments: FROM of shoulders.  Lymphadenopathy:     Cervical: No cervical adenopathy.  Skin:    General: Skin is warm and dry.  Neurological:     General: No focal deficit present.     Mental Status: She is alert and oriented to person, place, and time.  Psychiatric:        Mood and Affect: Mood normal.  Behavior: Behavior normal.        Thought Content: Thought content normal.        Judgment: Judgment normal.   No calf swelling  O2 sat is 97%  ECG--NSR  No results found for any visits on 01/31/19.     Assessment & Plan    1. Atypical chest pain Presently sounds pleuritic with no other symptoms. Check CXR and Use Aleve==2 BID for 1-2 weeks. Call if things change or worsen. - EKG 12-Lead - DG Chest 2 View; Future  2. Acute pain of left shoulder Referred--not a shoulder issue.      Cranford Mon, MD  [facility]

## 2019-02-01 ENCOUNTER — Telehealth: Payer: Self-pay | Admitting: Family Medicine

## 2019-02-01 ENCOUNTER — Ambulatory Visit
Admission: RE | Admit: 2019-02-01 | Discharge: 2019-02-01 | Disposition: A | Discharge status: Home / Self Care | Payer: BLUE CROSS/BLUE SHIELD | Source: Ambulatory Visit | Attending: Family Medicine | Admitting: Family Medicine

## 2019-02-01 DIAGNOSIS — R0789 Other chest pain: Secondary | ICD-10-CM

## 2019-02-01 NOTE — Telephone Encounter (Signed)
Pt checking on chest Xray results.  Please call pt back once they are in.  Thanks, [employer]

## 2019-02-02 ENCOUNTER — Telehealth: Payer: Self-pay

## 2019-02-02 NOTE — Telephone Encounter (Signed)
-----   Message from Jerrol Banana., MD sent at 02/02/2019 10:48 AM EDT ----- CXR ok

## 2019-02-02 NOTE — Telephone Encounter (Signed)
Patient notified of x-ray results.

## 2019-11-10 ENCOUNTER — Ambulatory Visit: Payer: Self-pay
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
## 2020-06-05 ENCOUNTER — Ambulatory Visit: Payer: Self-pay | Admitting: Dermatology

## 2020-08-15 ENCOUNTER — Ambulatory Visit: Payer: Self-pay | Admitting: *Deleted

## 2020-08-15 NOTE — Telephone Encounter (Signed)
Probably needs to be seen but I do not have any appointments at all.  Urgent care or recommend Zyrtec twice a day and Benadryl every 4 hours as needed/tolerated.

## 2020-08-15 NOTE — Telephone Encounter (Signed)
Pt called stating she thinks she is having an allergic reaction; on 08/14/20 at 1800 her face started feeling hot and she was getting itchy; she has hives on both elbow, both knees, ears; she has not eaten, or drank anything unusual; recommendations made per nurse triage protocol; the pt verbalized understanding but would like to be seen in the office; spoke with [facility] regarding scheduling the pt; she would like for this to be sent to the office for provider review; pt notified and also asked to consider urgent care; she can be contacted at [phone]  Reason for Disposition . Patient sounds very sick or weak to the triager . Widespread hives  Answer Assessment - Initial Assessment Questions 1. APPEARANCE: "What does the rash look like?"      hives 2. LOCATION: "Where is the rash located?"      Arms, legs, ears 3. NUMBER: "How many hives are there?"      To many to quantify 4. SIZE: "How big are the hives?" (inches, cm, compare to coins) "Do they all look the same or is there lots of variation in shape and size?"      Quarter sized 5. ONSET: "When did the hives begin?" (Hours or days ago)      08/14/20 at 1800 6. ITCHING: "Does it itch?" If Yes, ask: "How bad is the itch?"    - MILD: doesn't interfere with normal activities   - MODERATE-SEVERE: interferes with work, school, sleep, or other activities   severe 7. RECURRENT PROBLEM: "Have you had hives before?" If Yes, ask: "When was the last time?" and "What happened that time?"     Childhood; ? Candy with artificial sweetener, tide laundry detergent 8. TRIGGERS: "Were you exposed to any new food, plant, cosmetic product or animal just before the hives began?"     no 9. OTHER SYMPTOMS: "Do you have any other symptoms?" (e.g., fever, tongue swelling, difficulty breathing, abdominal pain)     Feels skin hot 10. PREGNANCY: "Is there any chance you are pregnant?" "When was your last menstrual period?"      Yes, 07/22/20  Protocols used:  HIVES-A-AH

## 2020-08-16 NOTE — Telephone Encounter (Signed)
Patient was advised.  

## 2021-05-08 ENCOUNTER — Telehealth (INDEPENDENT_AMBULATORY_CARE_PROVIDER_SITE_OTHER): Payer: BC Managed Care – PPO | Admitting: Family Medicine

## 2021-05-08 DIAGNOSIS — F988 Other specified behavioral and emotional disorders with onset usually occurring in childhood and adolescence: Secondary | ICD-10-CM | POA: Diagnosis not present

## 2021-05-08 MED ORDER — LISDEXAMFETAMINE DIMESYLATE 20 MG PO CAPS: 20.0000 mg | ORAL_CAPSULE | Freq: Every day | ORAL | 0 refills | Status: AC

## 2021-05-08 NOTE — Progress Notes (Signed)
MyChart Video Visit    Virtual Visit via Video Note   This visit type was conducted due to national recommendations for restrictions regarding the COVID-19 Pandemic (e.g. social distancing) in an effort to limit this patient's exposure and mitigate transmission in our community. This patient is at least at moderate risk for complications without adequate follow up. This format is felt to be most appropriate for this patient at this time. Physical exam was limited by quality of the video and audio technology used for the visit.   Patient location: home Provider location: office  I discussed the limitations of evaluation and management by telemedicine and the availability of in person appointments. The patient expressed understanding and agreed to proceed.  Patient: Erika [name]   DOB: [birth year]   40 y.o. Female  MRN: [id number] Visit Date: 05/08/2021  Today's healthcare provider: Wilhemena Durie, MD   No chief complaint on file.  Subjective    HPI  Patient is a 40 year old female who presents via video visit for evaluation to re-start medication for ADD.  Patient states that prior to having children she was on Adderall.  She stopped taking it once she got pregnant.  She is now back working and feels she needs to go back on it.  She has not been seen since July 2020.  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  She has a long history of being on Adderall before but she has a friend that states that Vyvanse would last her most of the day without a drop off the makes her feel bad.  Medications: Outpatient Medications Prior to Visit  Medication Sig   [DISCONTINUED] calcium carbonate (TUMS - DOSED IN MG ELEMENTAL CALCIUM) 500 MG chewable tablet Chew 1-2 tablets by mouth daily.   [DISCONTINUED] Prenatal Vit-Fe  Fumarate-FA (PRENATAL MULTIVITAMIN) TABS tablet Take 1 tablet by mouth daily at 12 noon.   No facility-administered medications prior to visit.    Review of Systems  Psychiatric/Behavioral:  Positive for decreased concentration. Negative for agitation, behavioral problems, confusion, dysphoric mood, hallucinations, self-injury, sleep disturbance and suicidal ideas. The patient is not nervous/anxious and is not hyperactive.       Objective    There were no vitals taken for this visit.    Physical Exam     Assessment & Plan     1. Attention deficit disorder, unspecified hyperactivity presence Will use starting dose of Vyvanse 20 and she is given 3 months prescriptions as I will be out myself for a couple of months.  She will need to come into the office for any refills.  She will continue to use condoms for birth control and is considering getting a tubal ligation   No follow-ups on file.     I discussed the assessment and treatment plan with the patient. The patient was provided an opportunity to ask questions and all were answered. The patient agreed with the plan and demonstrated an understanding of the instructions.   The patient was advised to call back or seek an in-person evaluation if the symptoms worsen or if the condition fails to improve as anticipated.  I provided 12  minutes of non-face-to-face time during this encounter.  I, Wilhemena Durie, MD, have reviewed all documentation for this visit. The documentation on 05/08/21  for the exam, diagnosis, procedures, and orders are all accurate and complete.   [name] Mon, MD [facility] [phone] (phone) [phone] (fax)  [place]

## 2021-05-09 ENCOUNTER — Telehealth: Payer: Self-pay | Admitting: Family Medicine

## 2021-05-09 NOTE — Telephone Encounter (Signed)
Patient called in states Pharmacy needs doctor prescriber approval for lisdexamfetamine (VYVANSE) 20 MG capsule before filling medication. Please call back.  [id number] [address], [place] Phone:  [phone]  Fax:  [phone]

## 2021-05-13 NOTE — Telephone Encounter (Signed)
PA started

## 2021-05-15 NOTE — Telephone Encounter (Signed)
PA was denied

## 2021-05-16 MED ORDER — AMPHETAMINE-DEXTROAMPHET ER 20 MG PO CP24
20.0000 mg | ORAL_CAPSULE | Freq: Every day | ORAL | 0 refills | Status: DC
Start: 2021-05-16 — End: 2021-05-16

## 2021-05-16 MED ORDER — AMPHETAMINE-DEXTROAMPHET ER 20 MG PO CP24: 20.0000 mg | ORAL_CAPSULE | Freq: Every day | ORAL | 0 refills | Status: DC

## 2021-05-16 NOTE — Telephone Encounter (Signed)
[facility] called saying [facility] not [facility] in [facility] and it will be several weeks.  She wants to know if it can  be sent to the [facility] in [place].  Sh called and they do have the prescription in stock .  CB#  [phone]

## 2021-05-16 NOTE — Telephone Encounter (Signed)
Please advise 

## 2021-05-16 NOTE — Addendum Note (Signed)
Addended by: Julieta Bellini on: 05/16/2021 10:45 AM   Modules accepted: Orders

## 2021-05-16 NOTE — Telephone Encounter (Signed)
Adderall XR sent in for pt. Vyvanse not approved.Keep f/u in 2023.

## 2021-05-17 NOTE — Telephone Encounter (Signed)
Patient called back waiting on status of Adderall being sent to [facility] pharmacy. Please call back.

## 2021-05-17 NOTE — Telephone Encounter (Signed)
Call to office- advised it may be Monday before call addressed- provider not in office today. Call to patient- left message: may be Monday before request is addressed- provider is not in office today.

## 2021-05-20 NOTE — Telephone Encounter (Signed)
Pt called in to get the status of the medication being sent to the [facility], please advise

## 2021-05-21 MED ORDER — AMPHETAMINE-DEXTROAMPHET ER 20 MG PO CP24
20.0000 mg | ORAL_CAPSULE | Freq: Every day | ORAL | 0 refills | Status: AC
Start: 2021-05-21 — End: ?

## 2021-05-21 MED ORDER — AMPHETAMINE-DEXTROAMPHET ER 20 MG PO CP24: 20.0000 mg | ORAL_CAPSULE | Freq: Every day | ORAL | 0 refills | Status: DC

## 2021-05-21 NOTE — Telephone Encounter (Signed)
Patient advised.

## 2021-05-21 NOTE — Addendum Note (Signed)
Addended by: Eulas Post on: 05/21/2021 07:57 AM   Modules accepted: Orders

## 2021-06-05 DIAGNOSIS — N939 Abnormal uterine and vaginal bleeding, unspecified: Secondary | ICD-10-CM | POA: Diagnosis not present

## 2021-06-05 DIAGNOSIS — Z6826 Body mass index (BMI) 26.0-26.9, adult: Secondary | ICD-10-CM | POA: Diagnosis not present

## 2021-06-05 DIAGNOSIS — Z01419 Encounter for gynecological examination (general) (routine) without abnormal findings: Secondary | ICD-10-CM | POA: Diagnosis not present

## 2021-06-21 ENCOUNTER — Other Ambulatory Visit: Payer: Self-pay | Admitting: Family Medicine

## 2021-06-25 DIAGNOSIS — N939 Abnormal uterine and vaginal bleeding, unspecified: Secondary | ICD-10-CM | POA: Diagnosis not present

## 2021-06-25 DIAGNOSIS — N840 Polyp of corpus uteri: Secondary | ICD-10-CM | POA: Diagnosis not present

## 2021-06-25 DIAGNOSIS — R102 Pelvic and perineal pain: Secondary | ICD-10-CM | POA: Diagnosis not present

## 2021-06-27 IMAGING — CR CHEST - 2 VIEW
1 series · 2 of 2 positions shown · non-contrast
Comparison: None.

CLINICAL DATA: Chest pain

EXAM:
CHEST - 2 VIEW

[Series 1: dg chest 2 view · 0.14mm/px · 2 of 2 slices shown]
[im 1/2]
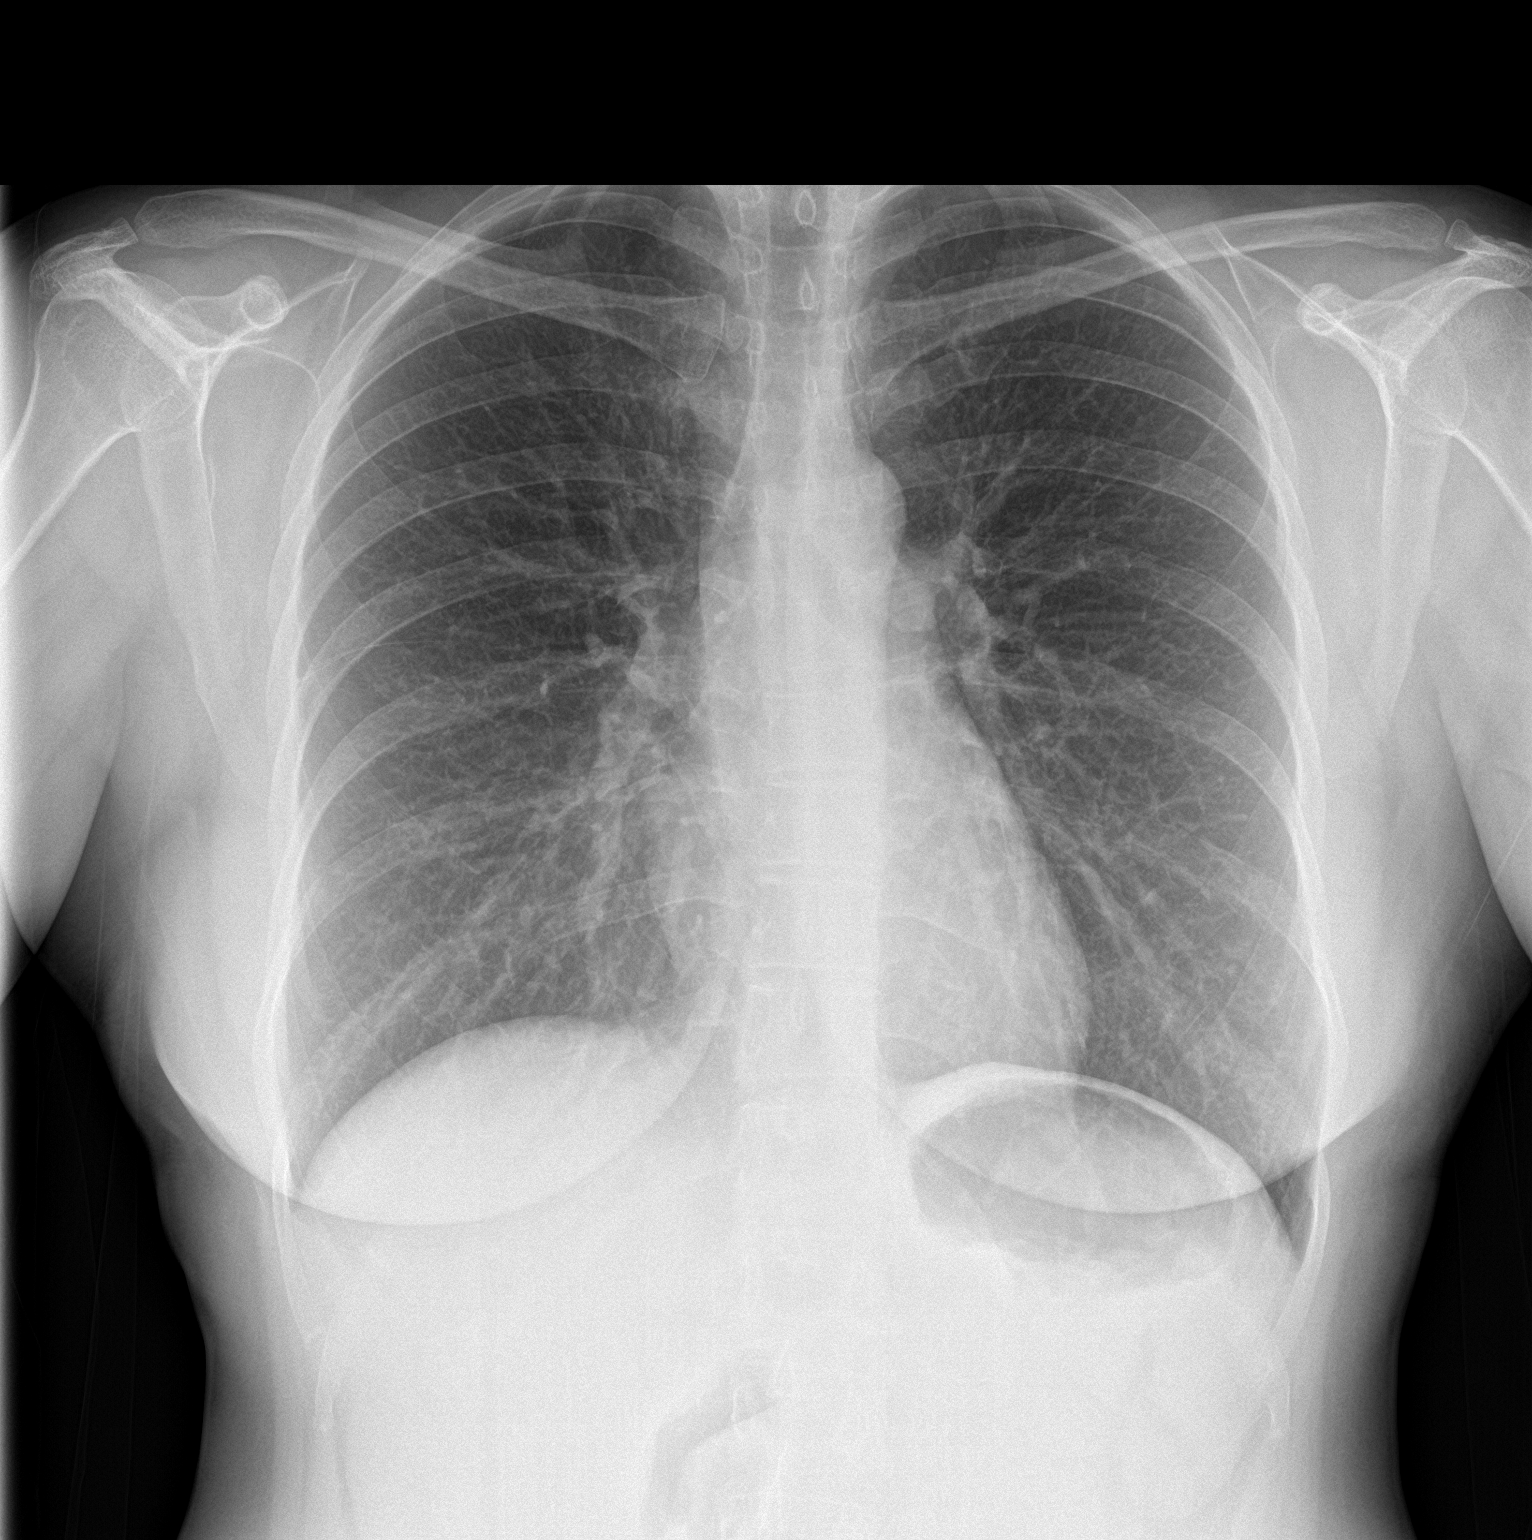
[im 2/2]
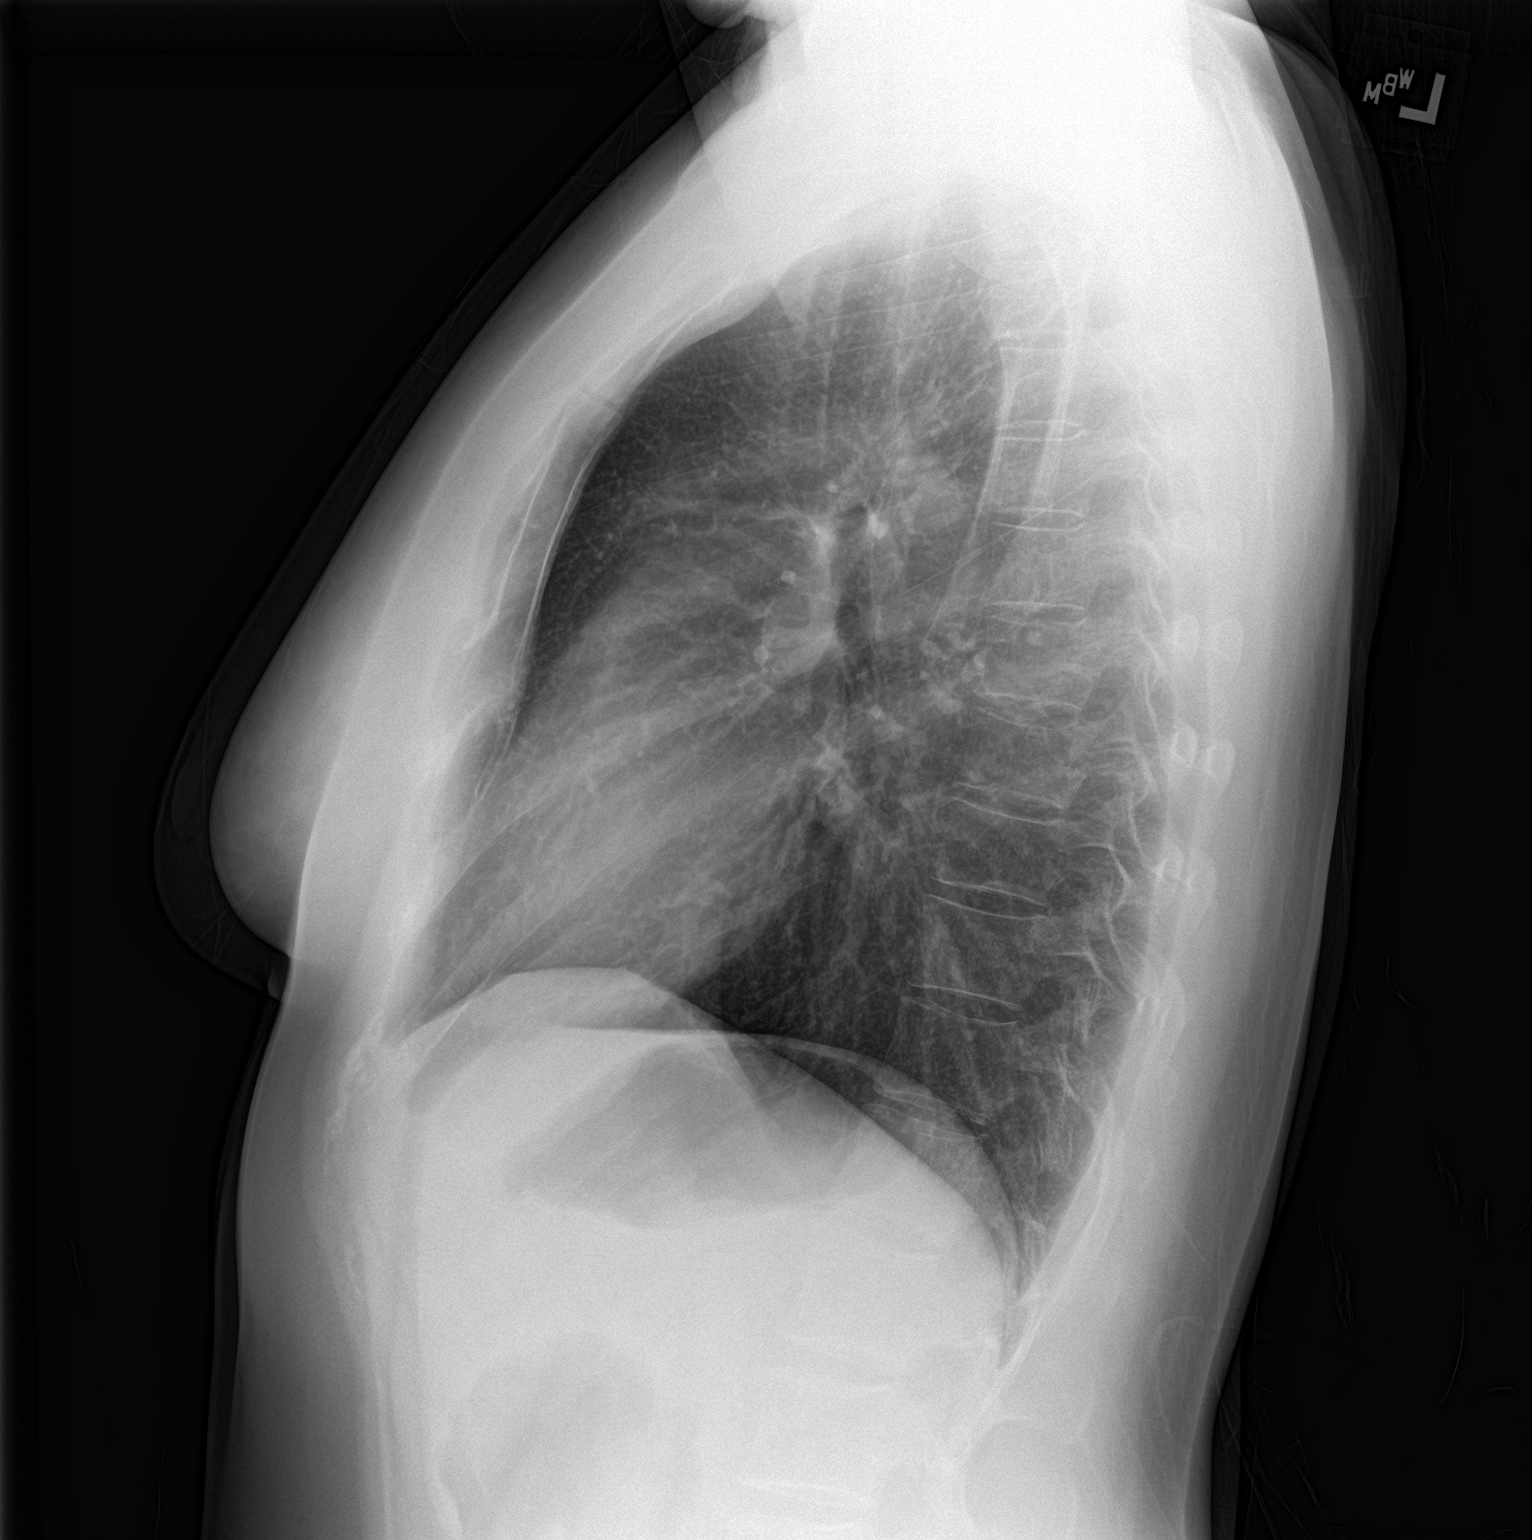

[2 of 2 positions shown; findings below may reference images not displayed]

FINDINGS: Lungs are clear. Heart size and pulmonary vascularity are normal. No
adenopathy. No pneumothorax. No bone lesions.
IMPRESSION: No edema or consolidation.

## 2021-07-23 DIAGNOSIS — N393 Stress incontinence (female) (male): Secondary | ICD-10-CM | POA: Diagnosis not present

## 2021-07-24 ENCOUNTER — Telehealth: Payer: Self-pay | Admitting: Family Medicine

## 2021-07-24 MED ORDER — AMPHETAMINE-DEXTROAMPHET ER 20 MG PO CP24: 20.0000 mg | ORAL_CAPSULE | Freq: Every day | ORAL | 0 refills | Status: AC

## 2021-07-24 NOTE — Telephone Encounter (Signed)
Pt called and stated that she has an appointment on 03/15 but will run out of ADDERALL 2/15. Could the patient been seen sooner or if enough can be sent in to last until appointment. Pt would prefer to be worked in. Please advise

## 2021-07-24 NOTE — Telephone Encounter (Signed)
Rx sent 

## 2021-09-18 ENCOUNTER — Ambulatory Visit: Payer: BC Managed Care – PPO | Admitting: Family Medicine

## 2021-09-18 NOTE — Progress Notes (Deleted)
?  ? ? ?  Established patient visit ? ? ?Patient: Erika [name]   DOB: 12-Jun-1981   41 y.o. Female  MRN: [id number] ?Visit Date: 09/18/2021 ? ?Today's healthcare provider: Wilhemena Durie, MD  ? ?No chief complaint on file. ? ?Subjective  ?  ?HPI  ?Follow up for Attention deficit disorder, unspecified hyperactivity presence: ? ?The patient was last seen for this 4 months ago. ?Changes made at last visit include; Will use starting dose of Vyvanse 20 and she is given 3 months prescriptions. ? ?She reports {excellent/good/fair/poor:19665} compliance with treatment. ?She feels that condition is {improved/worse/unchanged:3041574}. ?She {is/is not:21021397} having side effects. *** ? ?----------------------------------------------------------------------------------------- ? ? ?Medications: ?Outpatient Medications Prior to Visit  ?Medication Sig  ? amphetamine-dextroamphetamine (ADDERALL XR) 20 MG 24 hr capsule Take 1 capsule (20 mg total) by mouth daily. As needed for ADD  ? amphetamine-dextroamphetamine (ADDERALL XR) 20 MG 24 hr capsule Take 1 capsule (20 mg total) by mouth daily.  ? amphetamine-dextroamphetamine (ADDERALL XR) 20 MG 24 hr capsule Take 1 capsule (20 mg total) by mouth daily. As neede for ADD  ? lisdexamfetamine (VYVANSE) 20 MG capsule Take 1 capsule (20 mg total) by mouth daily. 1 daily as needed for work.  ? lisdexamfetamine (VYVANSE) 20 MG capsule Take 1 capsule (20 mg total) by mouth daily. 1daily for work days  ? lisdexamfetamine (VYVANSE) 20 MG capsule Take 1 capsule (20 mg total) by mouth daily.  ? ?No facility-administered medications prior to visit.  ? ? ?Review of Systems  ?Constitutional:  Negative for appetite change, chills, fatigue and fever.  ?Respiratory:  Negative for chest tightness and shortness of breath.   ?Cardiovascular:  Negative for chest pain and palpitations.  ?Gastrointestinal:  Negative for abdominal pain, nausea and vomiting.  ?Neurological:  Negative for dizziness and  weakness.  ? ?{Labs  Heme  Chem  Endocrine  Serology  Results Review (optional):23779} ?  Objective  ?  ?There were no vitals taken for this visit. ?{Show previous vital signs (optional):23777} ? ?Physical Exam  ?*** ? ?No results found for any visits on 09/18/21. ? Assessment & Plan  ?  ? ?*** ? ?No follow-ups on file.  ?   ? ?{provider attestation***:1} ? ? ?Richard Cranford Mon, MD  ?[facility] ?[phone] (phone) ?[phone] (fax) ? ?[facility] ?

## 2022-03-26 ENCOUNTER — Other Ambulatory Visit: Payer: Self-pay | Admitting: Internal Medicine

## 2022-03-26 DIAGNOSIS — Z1231 Encounter for screening mammogram for malignant neoplasm of breast: Secondary | ICD-10-CM

## 2022-03-27 ENCOUNTER — Ambulatory Visit: Payer: BC Managed Care – PPO | Admitting: Dermatology

## 2022-03-27 DIAGNOSIS — L280 Lichen simplex chronicus: Secondary | ICD-10-CM

## 2022-03-27 DIAGNOSIS — B353 Tinea pedis: Secondary | ICD-10-CM

## 2022-03-27 DIAGNOSIS — D4989 Neoplasm of unspecified behavior of other specified sites: Secondary | ICD-10-CM

## 2022-03-27 DIAGNOSIS — B351 Tinea unguium: Secondary | ICD-10-CM

## 2022-03-27 DIAGNOSIS — L818 Other specified disorders of pigmentation: Secondary | ICD-10-CM

## 2022-03-27 DIAGNOSIS — D2271 Melanocytic nevi of right lower limb, including hip: Secondary | ICD-10-CM

## 2022-03-27 DIAGNOSIS — L821 Other seborrheic keratosis: Secondary | ICD-10-CM

## 2022-03-27 DIAGNOSIS — D2272 Melanocytic nevi of left lower limb, including hip: Secondary | ICD-10-CM | POA: Diagnosis not present

## 2022-03-27 DIAGNOSIS — D229 Melanocytic nevi, unspecified: Secondary | ICD-10-CM

## 2022-03-27 DIAGNOSIS — D485 Neoplasm of uncertain behavior of skin: Secondary | ICD-10-CM

## 2022-03-27 DIAGNOSIS — D239 Other benign neoplasm of skin, unspecified: Secondary | ICD-10-CM

## 2022-03-27 DIAGNOSIS — L578 Other skin changes due to chronic exposure to nonionizing radiation: Secondary | ICD-10-CM

## 2022-03-27 DIAGNOSIS — Z1283 Encounter for screening for malignant neoplasm of skin: Secondary | ICD-10-CM | POA: Diagnosis not present

## 2022-03-27 DIAGNOSIS — D1801 Hemangioma of skin and subcutaneous tissue: Secondary | ICD-10-CM

## 2022-03-27 DIAGNOSIS — L814 Other melanin hyperpigmentation: Secondary | ICD-10-CM

## 2022-03-27 HISTORY — DX: Other benign neoplasm of skin, unspecified: D23.9

## 2022-03-27 HISTORY — DX: Other specified disorders of pigmentation: L81.8

## 2022-03-27 MED ORDER — CICLOPIROX 8 % EX SOLN: Freq: Every day | CUTANEOUS | 11 refills | Status: AC

## 2022-03-27 MED ORDER — TRIAMCINOLONE ACETONIDE 0.1 % EX CREA: TOPICAL_CREAM | CUTANEOUS | 0 refills | Status: AC

## 2022-03-27 MED ORDER — MUPIROCIN 2 % EX OINT: TOPICAL_OINTMENT | CUTANEOUS | 0 refills | Status: AC

## 2022-03-27 NOTE — Progress Notes (Signed)
New Patient Visit  Subjective  Erika [name] is a 41 y.o. female who presents for the following: Annual Exam. The patient presents for Total-Body Skin Exam (TBSE) for skin cancer screening and mole check.  The patient has spots, moles and lesions to be evaluated, some may be new or changing.  The following portions of the chart were reviewed this encounter and updated as appropriate:   Tobacco  Allergies  Meds  Problems  Med Hx  Surg Hx  Fam Hx      Review of Systems:  No other skin or systemic complaints except as noted in HPI or Assessment and Plan.  Objective  Well appearing patient in no apparent distress; mood and affect are within normal limits.  A full examination was performed including scalp, head, eyes, ears, nose, lips, neck, chest, axillae, abdomen, back, buttocks, bilateral upper extremities, bilateral lower extremities, hands, feet, fingers, toes, fingernails, and toenails. All findings within normal limits unless otherwise noted below.  R med calf sup 0.2 cm dark brown macule.     R med calf inf 0.4 cm dark and medium brown papule.  L post calf 0.2 cm medium and dark thin papule     L lat ankle Lichenified plaque  B/L foot and toenails Scale of the feet and toenail dystrophy.     Assessment & Plan  Neoplasm of uncertain behavior of skin (3) R med calf sup  Epidermal / dermal shaving  Lesion diameter (cm):  0.2 Informed consent: discussed and consent obtained   Timeout: patient name, date of birth, surgical site, and procedure verified   Procedure prep:  Patient was prepped and draped in usual sterile fashion Prep type:  Isopropyl alcohol Anesthesia: the lesion was anesthetized in a standard fashion   Anesthetic:  1% lidocaine w/ epinephrine 1-100,000 buffered w/ 8.4% NaHCO3 Instrument used: flexible razor blade   Hemostasis achieved with: pressure, aluminum chloride and electrodesiccation   Outcome: patient tolerated procedure well    Post-procedure details: sterile dressing applied and wound care instructions given   Dressing type: bandage and petrolatum    mupirocin ointment (BACTROBAN) 2 % Apply to healing wound QD until healed.  Specimen 1 - Surgical pathology Differential Diagnosis: D48.5 r/o dysplastic nevus Check Margins: No  R med calf inf  Epidermal / dermal shaving  Lesion diameter (cm):  0.4 Informed consent: discussed and consent obtained   Timeout: patient name, date of birth, surgical site, and procedure verified   Procedure prep:  Patient was prepped and draped in usual sterile fashion Prep type:  Isopropyl alcohol Anesthesia: the lesion was anesthetized in a standard fashion   Anesthetic:  1% lidocaine w/ epinephrine 1-100,000 buffered w/ 8.4% NaHCO3 Instrument used: flexible razor blade   Hemostasis achieved with: pressure, aluminum chloride and electrodesiccation   Outcome: patient tolerated procedure well   Post-procedure details: sterile dressing applied and wound care instructions given   Dressing type: bandage and petrolatum    Specimen 2 - Surgical pathology Differential Diagnosis: D48.5 r/o dysplastic nevus  Check Margins: No  L post calf  Epidermal / dermal shaving  Lesion diameter (cm):  0.2 Informed consent: discussed and consent obtained   Timeout: patient name, date of birth, surgical site, and procedure verified   Procedure prep:  Patient was prepped and draped in usual sterile fashion Prep type:  Isopropyl alcohol Anesthesia: the lesion was anesthetized in a standard fashion   Anesthetic:  1% lidocaine w/ epinephrine 1-100,000 buffered w/ 8.4% NaHCO3 Instrument used:  flexible razor blade   Hemostasis achieved with: pressure, aluminum chloride and electrodesiccation   Outcome: patient tolerated procedure well   Post-procedure details: sterile dressing applied and wound care instructions given   Dressing type: bandage and petrolatum    Specimen 3 - Surgical  pathology Differential Diagnosis: D48.5 r/o dysplastic nevus Check Margins: No  Start Mupirocin 2% ointment QD until healed.   Lichen simplex chronicus L lat ankle  Due to rubbing -   Start Triamcinolone 0.1% cream to aa QD-BID x 2-3 weeks then stop. Cover with bandage after applying. Avoid applying to face, groin, and axilla. Use as directed. Long-term use can cause thinning of the skin.  Topical steroids (such as triamcinolone, fluocinolone, fluocinonide, mometasone, clobetasol, halobetasol, betamethasone, hydrocortisone) can cause thinning and lightening of the skin if they are used for too long in the same area. Your physician has selected the right strength medicine for your problem and area affected on the body. Please use your medication only as directed by your physician to prevent side effects.   Avoid rubbing  triamcinolone cream (KENALOG) 0.1 % - L lat ankle Apply to aa ankle QD and cover with bandage. Use up to two weeks PRN. Avoid applying to face, groin, and axilla. Use as directed. Long-term use can cause thinning of the skin.  Onychomycosis B/L foot and toenails  With tinea pedis  Discussed treatment options of topical treatment vs oral treatment vs laser (would need referral to podiatry).   Will prescribe [employer] Ciclopirox 8% / Itraconazole 3% / Fluconazole 3% / Terbinafine HCl 1% / Ibuprofen 2% DMSO Suspension to apply to affected nails once to twice a day until clear. Avoid getting medication on surrounding skin. Optionally the patient can apply a thin layer of vaseline to surrounding skin before applying the suspension to help protect the skin. This medication is not recommended during pregnancy or lactation. The patient was advised this is not covered by insurance since it is made by a compounding pharmacy. They will receive an email to check out and the medication will be mailed to their home.   Start OTC Terbinafine cream BID all over feet and between the  toes. Once clear use QW for prevention.    ciclopirox (PENLAC) 8 % solution - B/L foot and toenails Apply topically at bedtime. Apply to toenails QD-BID until clear. Then QW thereafter for prevention.    Lentigines - Scattered tan macules - Due to sun exposure - Benign-appearing, observe - Recommend daily broad spectrum sunscreen SPF 30+ to sun-exposed areas, reapply every 2 hours as needed. - Call for any changes  Seborrheic Keratoses - Stuck-on, waxy, tan-brown papules and/or plaques  - Benign-appearing - Discussed benign etiology and prognosis. - Observe - Call for any changes  Melanocytic Nevi - Tan-brown and/or pink-flesh-colored symmetric macules and papules - Benign appearing on exam today - Observation - Call clinic for new or changing moles - Recommend daily use of broad spectrum spf 30+ sunscreen to sun-exposed areas.   Hemangiomas - Red papules - Discussed benign nature - Observe - Call for any changes  Actinic Damage - Chronic condition, secondary to cumulative UV/sun exposure - diffuse scaly erythematous macules with underlying dyspigmentation - Recommend daily broad spectrum sunscreen SPF 30+ to sun-exposed areas, reapply every 2 hours as needed.  - Staying in the shade or wearing long sleeves, sun glasses (UVA+UVB protection) and wide brim hats (4-inch brim around the entire circumference of the hat) are also recommended for sun protection.  -  Call for new or changing lesions.  Skin cancer screening performed today.  Return for 4-6 mths tinea f/u, 1-3 years TBSE.  Luther Redo, CMA, am acting as scribe for Forest Gleason, MD .  Documentation: I have reviewed the above documentation for accuracy and completeness, and I agree with the above.  Forest Gleason, MD

## 2022-03-27 NOTE — Patient Instructions (Addendum)
Instructions for [facility] Medications  One or more of your medications was sent to the [facility] mail order compounding pharmacy. You will receive an email from them and can purchase the medicine through that link. It will then be mailed to your home at the address you confirmed. If for any reason you do not receive an email from them, please check your spam folder. If you still do not find the email, please let us know. [facility] phone number is [phone].      Wound Care Instructions  Cleanse wound gently with soap and water once a day then pat dry with clean gauze. Apply a thin coat of Petrolatum (petroleum jelly, "Vaseline") over the wound (unless you have an allergy to this). We recommend that you use a new, sterile tube of Vaseline. Do not pick or remove scabs. Do not remove the yellow or white "healing tissue" from the base of the wound.  Cover the wound with fresh, clean, nonstick gauze and secure with paper tape. You may use Band-Aids in place of gauze and tape if the wound is small enough, but would recommend trimming much of the tape off as there is often too much. Sometimes Band-Aids can irritate the skin.  You should call the office for your biopsy report after 1 week if you have not already been contacted.  If you experience any problems, such as abnormal amounts of bleeding, swelling, significant bruising, significant pain, or evidence of infection, please call the office immediately.  FOR ADULT SURGERY PATIENTS: If you need something for pain relief you may take 1 extra strength Tylenol (acetaminophen) AND 2 Ibuprofen (200mg each) together every 4 hours as needed for pain. (do not take these if you are allergic to them or if you have a reason you should not take them.) Typically, you may only need pain medication for 1 to 3 days.   Due to recent changes in healthcare laws, you may see results of your pathology and/or laboratory studies on MyChart before the  doctors have had a chance to review them. We understand that in some cases there may be results that are confusing or concerning to you. Please understand that not all results are received at the same time and often the doctors may need to interpret multiple results in order to provide you with the best plan of care or course of treatment. Therefore, we ask that you please give us 2 business days to thoroughly review all your results before contacting the office for clarification. Should we see a critical lab result, you will be contacted sooner.   If You Need Anything After Your Visit  If you have any questions or concerns for your doctor, please call our main line at [phone] and press option 4 to reach your doctor's medical assistant. If no one answers, please leave a voicemail as directed and we will return your call as soon as possible. Messages left after 4 pm will be answered the following business day.   You may also send us a message via MyChart. We typically respond to MyChart messages within 1-2 business days.  For prescription refills, please ask your pharmacy to contact our office. Our fax number is [phone].  If you have an urgent issue when the clinic is closed that cannot wait until the next business day, you can page your doctor at the number below.    Please note that while we do our best to be available for urgent issues outside of   office hours, we are not available 24/7.   If you have an urgent issue and are unable to reach [place], you may choose to seek medical care at your doctor's office, retail clinic, urgent care center, or emergency room.  If you have a medical emergency, please immediately call 911 or go to the emergency department.  Pager Numbers  - Dr. Nehemiah Massed: [phone]  - Dr. Laurence Ferrari: [phone]  - Dr. Nicole Kindred: [phone]  In the event of inclement weather, please call our main line at [phone] for an update on the status of any delays or  closures.  Dermatology Medication Tips: Please keep the boxes that topical medications come in in order to help keep track of the instructions about where and how to use these. Pharmacies typically print the medication instructions only on the boxes and not directly on the medication tubes.   If your medication is too expensive, please contact our office at [phone] option 4 or send [place] a message through [place].   We are unable to tell what your co-pay for medications will be in advance as this is different depending on your insurance coverage. However, we may be able to find a substitute medication at lower cost or fill out paperwork to get insurance to cover a needed medication.   If a prior authorization is required to get your medication covered by your insurance company, please allow [place] 1-2 business days to complete this process.  Drug prices often vary depending on where the prescription is filled and some pharmacies may offer cheaper prices.  The website www.goodrx.com contains coupons for medications through different pharmacies. The prices here do not account for what the cost may be with help from insurance (it may be cheaper with your insurance), but the website can give you the price if you did not use any insurance.  - You can print the associated coupon and take it with your prescription to the pharmacy.  - You may also stop by our office during regular business hours and pick up a GoodRx coupon card.  - If you need your prescription sent electronically to a different pharmacy, notify our office through [facility] or by phone at [phone] option 4.     Si Usted Necesita Algo Despus de Su Visita  Tambin puede enviarnos un mensaje a travs de Pharmacist, community. Por lo general respondemos a los mensajes de MyChart en el transcurso de 1 a 2 das hbiles.  Para renovar recetas, por favor pida a su farmacia que se ponga en contacto con nuestra oficina. Harland Dingwall de fax  es [place] [phone].  Si tiene un asunto urgente cuando la clnica est cerrada y que no puede esperar hasta el siguiente da hbil, puede llamar/localizar a su doctor(a) al nmero que aparece a continuacin.   Por favor, tenga en cuenta que aunque hacemos todo lo posible para estar disponibles para asuntos urgentes fuera del horario de [place], no estamos disponibles las 24 horas del da, los 7 das de la [place].   Si tiene un problema urgente y no puede comunicarse con nosotros, puede optar por buscar atencin mdica  en el consultorio de su doctor(a), en una clnica privada, en un centro de atencin urgente o en una sala de emergencias.  Si tiene Engineering geologist, por favor llame inmediatamente al 911 o vaya a la sala de emergencias.  Nmeros de bper  - Dr. Nehemiah Massed: [phone]  - Dra. Moye: [phone]  - Dra. Nicole Kindred: [phone]  En caso de inclemencias  del tiempo, por favor llame a nuestra lnea principal al [phone] para una actualizacin sobre el estado de cualquier retraso o cierre.  Consejos para la medicacin en dermatologa: Por favor, guarde las cajas en las que vienen los medicamentos de uso tpico para ayudarle a seguir las instrucciones sobre dnde y cmo usarlos. Las farmacias generalmente imprimen las instrucciones del medicamento slo en las cajas y no directamente en los tubos del medicamento.   Si su medicamento es muy caro, por favor, pngase en contacto con nuestra oficina llamando al [phone] y presione la opcin 4 o envenos un mensaje a travs de MyChart.   No podemos decirle cul ser su copago por los medicamentos por adelantado ya que esto es diferente dependiendo de la cobertura de su seguro. Sin embargo, es posible que podamos encontrar un medicamento sustituto a menor costo o llenar un formulario para que el seguro cubra el medicamento que se considera necesario.   Si se requiere una autorizacin previa para que su compaa de seguros cubra  su medicamento, por favor permtanos de 1 a 2 das hbiles para completar este proceso.  Los precios de los medicamentos varan con frecuencia dependiendo del lugar de dnde se surte la receta y alguna farmacias pueden ofrecer precios ms baratos.  El sitio web www.goodrx.com tiene cupones para medicamentos de diferentes farmacias. Los precios aqu no tienen en cuenta lo que podra costar con la ayuda del seguro (puede ser ms barato con su seguro), pero el sitio web puede darle el precio si no utiliz ningn seguro.  - Puede imprimir el cupn correspondiente y llevarlo con su receta a la farmacia.  - Tambin puede pasar por nuestra oficina durante el horario de atencin regular y recoger una tarjeta de cupones de GoodRx.  - Si necesita que su receta se enve electrnicamente a una farmacia diferente, informe a nuestra oficina a travs de MyChart de [place] o por telfono llamando al [phone] y presione la opcin 4.  

## 2022-03-29 ENCOUNTER — Encounter: Payer: Self-pay | Admitting: Dermatology

## 2022-04-01 ENCOUNTER — Telehealth: Payer: Self-pay

## 2022-04-01 NOTE — Telephone Encounter (Signed)
Discussed pathology results with patient. Appointment for excision scheduled for 04/23/2022 at 9:10am. Discussed DN spectrum and AMP. Patient voiced understanding. Will schedule 6 month follow up at next visit. JP

## 2022-04-01 NOTE — Telephone Encounter (Signed)
-----   Message from Alfonso Patten, MD sent at 04/01/2022  2:25 PM EDT ----- 1. Skin , right med calf sup ATYPICAL INTRAEPIDERMAL MELANOCYTIC PROLIFERATION, CLOSE TO MARIN "could not rule out severely atypical mole" --> recommend excision  This is suspicious for a SEVERELY ATYPICAL MOLE. On the spectrum from normal mole to melanoma skin cancer, this is in between the two but closer towards a melanoma skin cancer.    2. Skin , right med calf inf DYSPLASTIC JUNCTIONAL LENTIGINOUS NEVUS WITH MODERATE ATYPIA, LIMITED MARGINS FREE  This is a MODERATELY ATYPICAL MOLE. On the spectrum from normal mole to melanoma skin cancer, this is in between the two. - We need to recheck this area sometime in the next 6 months to be sure there is no evidence of the atypical mole coming back. If there is any color coming back, we would recommend repeating the biopsy to be sure the cells look normal.  - People who have a history of atypical moles do have a slightly increased risk of developing melanoma somewhere on the body, so a yearly full body skin exam by a dermatologist is recommended.  - Monthly self skin checks and daily sun protection are also recommended.  - Please call if you notice a dark spot coming back where this biopsy was taken.  - Please also call if you notice any new or changing spots anywhere else on the body before your follow-up visit.    3. Skin , left post calf DYSPLASTIC JUNCTIONAL LENTIGINOUS NEVUS WITH MODERATE ATYPIA, IRRITATED, LIMITED MARGINS FREE --> recheck within 6 months  This is a MODERATELY ATYPICAL MOLE. On the spectrum from normal mole to melanoma skin cancer, this is in between the two. - We need to recheck this area sometime in the next 6 months to be sure there is no evidence of the atypical mole coming back. If there is any color coming back, we would recommend repeating the biopsy to be sure the cells look normal.  - People who have a history of atypical moles do have a  slightly increased risk of developing melanoma somewhere on the body, so a yearly full body skin exam by a dermatologist is recommended.  - Monthly self skin checks and daily sun protection are also recommended.  - Please call if you notice a dark spot coming back where this biopsy was taken.  - Please also call if you notice any new or changing spots anywhere else on the body before your follow-up visit.   MAs please call with results and schedule. Let me know if she has questions. Thank you!

## 2022-04-02 ENCOUNTER — Other Ambulatory Visit: Payer: Self-pay | Admitting: Internal Medicine

## 2022-04-02 DIAGNOSIS — Z1231 Encounter for screening mammogram for malignant neoplasm of breast: Secondary | ICD-10-CM

## 2022-04-17 DIAGNOSIS — Z1231 Encounter for screening mammogram for malignant neoplasm of breast: Secondary | ICD-10-CM

## 2022-04-23 ENCOUNTER — Encounter: Payer: BC Managed Care – PPO | Admitting: Dermatology

## 2022-05-21 ENCOUNTER — Telehealth: Payer: Self-pay

## 2022-05-21 ENCOUNTER — Encounter: Payer: BC Managed Care – PPO | Admitting: Dermatology

## 2022-05-21 NOTE — Telephone Encounter (Signed)
Patient cancelled surgery appointment with Dr. Laurence Ferrari today to excise bx proven ATYPICAL INTRAEPIDERMAL MELANOCYTIC PROLIFERATION  at right med calf superior. I called patient to reschedule her and had to LVM for her to return my call. As of right now we vae surgery appts available 12/6 at 9am, 12/12 at 9am, 12/13 at 9 or 11am. Lurlean Horns., RMA

## 2022-05-22 ENCOUNTER — Encounter: Payer: Self-pay | Admitting: Dermatology

## 2022-05-22 NOTE — Telephone Encounter (Signed)
LVM for patient to call office to reschedule appt with Dr. Laurence Ferrari. Lurlean Horns., RMA

## 2022-06-11 NOTE — Telephone Encounter (Signed)
Tried calling patient regarding need to reschedule her surgery. She did not answer. LMOM for patient to return call.

## 2022-07-23 ENCOUNTER — Ambulatory Visit (INDEPENDENT_AMBULATORY_CARE_PROVIDER_SITE_OTHER): Payer: BC Managed Care – PPO | Admitting: Dermatology

## 2022-07-23 ENCOUNTER — Encounter: Payer: Self-pay | Admitting: Dermatology

## 2022-07-23 DIAGNOSIS — D492 Neoplasm of unspecified behavior of bone, soft tissue, and skin: Secondary | ICD-10-CM

## 2022-07-23 DIAGNOSIS — D487 Neoplasm of uncertain behavior of other specified sites: Secondary | ICD-10-CM | POA: Diagnosis not present

## 2022-07-23 MED ORDER — MUPIROCIN 2 % EX OINT: 1.0000 | TOPICAL_OINTMENT | Freq: Every day | CUTANEOUS | 0 refills | Status: AC

## 2022-07-23 NOTE — Progress Notes (Signed)
   Follow-Up Visit   Subjective  Erika [name] is a 42 y.o. female who presents for the following: Procedure (Patient here today for excision at right med calf sup for bx proven ATYPICAL INTRAEPIDERMAL MELANOCYTIC PROLIFERATION.).  The following portions of the chart were reviewed this encounter and updated as appropriate:   Tobacco  Allergies  Meds  Problems  Med Hx  Surg Hx  Fam Hx      Review of Systems:  No other skin or systemic complaints except as noted in HPI or Assessment and Plan.  Objective  Well appearing patient in no apparent distress; mood and affect are within normal limits.  A focused examination was performed including right lower leg. Relevant physical exam findings are noted in the Assessment and Plan.  right medial calf superior Pink bx site    Assessment & Plan  Neoplasm of skin right medial calf superior  Skin excision  Lesion length (cm):  0.4 Lesion width (cm):  0.4 Margin per side (cm):  0.5 Total excision diameter (cm):  1.4 Informed consent: discussed and consent obtained   Timeout: patient name, date of birth, surgical site, and procedure verified   Procedure prep:  Patient was prepped and draped in usual sterile fashion Prep type:  Chlorhexidine Anesthesia: the lesion was anesthetized in a standard fashion   Anesthetic:  1% lidocaine w/ epinephrine 1-100,000 buffered w/ 8.4% NaHCO3 (6 cc lido w/epi, 6 cc bupivicaine) Instrument used: #15 blade   Hemostasis achieved with: pressure and electrodesiccation    Skin repair Complexity:  Intermediate Final length (cm):  3.9 Subcutaneous layers (deep stitches):  Suture size:  3-0 Suture type: Vicryl (polyglactin 910)   Fine/surface layer approximation (top stitches):  Suture size:  4-0 Suture type: Prolene (polypropylene)   Stitches: simple running   Suture removal (days):  7  mupirocin ointment (BACTROBAN) 2 % Apply 1 Application topically daily.  Specimen 1 - Surgical  pathology Differential Diagnosis: Bx proven ATYPICAL INTRAEPIDERMAL MELANOCYTIC PROLIFERATION  Check Margins: yes Pink bx site [phone]   Return in about 1 week (around 07/30/2022) for Suture Removal.  Graciella Belton, RMA, am acting as scribe for Forest Gleason, MD .  Documentation: I have reviewed the above documentation for accuracy and completeness, and I agree with the above.  Forest Gleason, MD

## 2022-07-23 NOTE — Patient Instructions (Signed)
Wound Care Instructions for After Surgery  On the day following your surgery, you should begin doing daily dressing changes until your sutures are removed: Remove the bandage. Cleanse the wound gently with soap and water.  Make sure you then dry the skin surrounding the wound completely or the tape will not stick to the skin. Do not use cotton balls on the wound. After the wound is clean and dry, apply the ointment (either prescription antibiotic prescribed by your doctor or plain Vaseline if nothing was prescribed) gently with a Q-tip. If you are using a bandaid to cover: Apply a bandaid large enough to cover the entire wound. If you do not have a bandaid large enough to cover the wound OR if you are sensitive to bandaid adhesive: Cut a non-stick pad (such as Telfa) to fit the size of the wound.  Cover the wound with the non-stick pad. If the wound is draining, you may want to add a small amount of gauze on top of the non-stick pad for a little added compression to the area. Use tape to seal the area completely.  For the next 1-2 weeks: Be sure to keep the wound moist with ointment 24/7 to ensure best healing. If you are unable to cover the wound with a bandage to hold the ointment in place, you may need to reapply the ointment several times a day. Do not bend over or lift heavy items to reduce the chance of elevated blood pressure to the wound. Do not participate in particularly strenuous activities.  Below is a list of dressing supplies you might need.  Cotton-tipped applicators - Q-tips Gauze pads (2x2 and/or 4x4) - All-Purpose Sponges New and clean tube of petroleum jelly (Vaseline) OR prescription antibiotic ointment if prescribed Either a bandaid large enough to cover the entire wound OR non-stick dressing material (Telfa) and Tape (Paper or Hypafix)  FOR ADULT SURGERY PATIENTS: If you need something for pain relief, you may take 1 extra strength Tylenol (acetaminophen) and 2  ibuprofen (200 mg) together every 4 hours as needed. (Do not take these medications if you are allergic to them or if you know you cannot take them for any other reason). Typically you may only need pain medication for 1-3 days.   Comments on the Post-Operative Period Slight swelling and redness often appear around the wound. This is normal and will disappear within several days following the surgery. The healing wound will drain a brownish-red-yellow discharge during healing. This is a normal phase of wound healing. As the wound begins to heal, the drainage may increase in amount. Again, this drainage is normal. Notify us if the drainage becomes persistently bloody, excessively swollen, or intensely painful or develops a foul odor or red streaks.  The healing wound will also typically be itchy. This is normal. If you have severe or persistent pain, Notify us if the discomfort is severe or persistent. Avoid alcoholic beverages when taking pain medicine.  In Case of Wound Hemorrhage A wound hemorrhage is when the bandage suddenly becomes soaked with bright red blood and flows profusely. If this happens, sit down or lie down with your head elevated. If the wound has a dressing on it, do not remove the dressing. Apply pressure to the existing gauze. If the wound is not covered, use a gauze pad to apply pressure and continue applying the pressure for 20 minutes without peeking. DO NOT COVER THE WOUND WITH A LARGE TOWEL OR WASH CLOTH. Release your hand from the   wound site but do not remove the dressing. If the bleeding has stopped, gently clean around the wound. Leave the dressing in place for 24 hours if possible. This wait time allows the blood vessels to close off so that you do not spark a new round of bleeding by disrupting the newly clotted blood vessels with an immediate dressing change. If the bleeding does not subside, continue to hold pressure for 40 minutes. If bleeding continues, page your  physician, contact an After Hours clinic or go to the Emergency Room.  Due to recent changes in healthcare laws, you may see results of your pathology and/or laboratory studies on MyChart before the doctors have had a chance to review them. We understand that in some cases there may be results that are confusing or concerning to you. Please understand that not all results are received at the same time and often the doctors may need to interpret multiple results in order to provide you with the best plan of care or course of treatment. Therefore, we ask that you please give us 2 business days to thoroughly review all your results before contacting the office for clarification. Should we see a critical lab result, you will be contacted sooner.   If You Need Anything After Your Visit  If you have any questions or concerns for your doctor, please call our main line at [phone] and press option 4 to reach your doctor's medical assistant. If no one answers, please leave a voicemail as directed and we will return your call as soon as possible. Messages left after 4 pm will be answered the following business day.   You may also send us a message via MyChart. We typically respond to MyChart messages within 1-2 business days.  For prescription refills, please ask your pharmacy to contact our office. Our fax number is [phone].  If you have an urgent issue when the clinic is closed that cannot wait until the next business day, you can page your doctor at the number below.    Please note that while we do our best to be available for urgent issues outside of office hours, we are not available 24/7.   If you have an urgent issue and are unable to reach us, you may choose to seek medical care at your doctor's office, retail clinic, urgent care center, or emergency room.  If you have a medical emergency, please immediately call 911 or go to the emergency department.  Pager Numbers  - Dr. Kowalski:  [phone]  - Dr. Moye: [phone]  - Dr. Stewart: [phone]  In the event of inclement weather, please call our main line at [phone] for an update on the status of any delays or closures.  Dermatology Medication Tips: Please keep the boxes that topical medications come in in order to help keep track of the instructions about where and how to use these. Pharmacies typically print the medication instructions only on the boxes and not directly on the medication tubes.   If your medication is too expensive, please contact our office at [phone] option 4 or send us a message through MyChart.   We are unable to tell what your co-pay for medications will be in advance as this is different depending on your insurance coverage. However, we may be able to find a substitute medication at lower cost or fill out paperwork to get insurance to cover a needed medication.   If a prior authorization is required to get your medication covered by   your insurance company, please allow us 1-2 business days to complete this process.  Drug prices often vary depending on where the prescription is filled and some pharmacies may offer cheaper prices.  The website www.goodrx.com contains coupons for medications through different pharmacies. The prices here do not account for what the cost may be with help from insurance (it may be cheaper with your insurance), but the website can give you the price if you did not use any insurance.  - You can print the associated coupon and take it with your prescription to the pharmacy.  - You may also stop by our office during regular business hours and pick up a GoodRx coupon card.  - If you need your prescription sent electronically to a different pharmacy, notify our office through [facility] MyChart or by phone at [phone] option 4.     Si Usted Necesita Algo Despus de Su Visita  Tambin puede enviarnos un mensaje a travs de MyChart. Por lo general  respondemos a los mensajes de MyChart en el transcurso de 1 a 2 das hbiles.  Para renovar recetas, por favor pida a su farmacia que se ponga en contacto con nuestra oficina. Nuestro nmero de fax es el [phone].  Si tiene un asunto urgente cuando la clnica est cerrada y que no puede esperar hasta el siguiente da hbil, puede llamar/localizar a su doctor(a) al nmero que aparece a continuacin.   Por favor, tenga en cuenta que aunque hacemos todo lo posible para estar disponibles para asuntos urgentes fuera del horario de oficina, no estamos disponibles las 24 horas del da, los 7 das de la semana.   Si tiene un problema urgente y no puede comunicarse con nosotros, puede optar por buscar atencin mdica  en el consultorio de su doctor(a), en una clnica privada, en un centro de atencin urgente o en una sala de emergencias.  Si tiene una emergencia mdica, por favor llame inmediatamente al 911 o vaya a la sala de emergencias.  Nmeros de bper  - Dr. Kowalski: [phone]  - Dra. Moye: [phone]  - Dra. Stewart: [phone]  En caso de inclemencias del tiempo, por favor llame a nuestra lnea principal al [phone] para una actualizacin sobre el estado de cualquier retraso o cierre.  Consejos para la medicacin en dermatologa: Por favor, guarde las cajas en las que vienen los medicamentos de uso tpico para ayudarle a seguir las instrucciones sobre dnde y cmo usarlos. Las farmacias generalmente imprimen las instrucciones del medicamento slo en las cajas y no directamente en los tubos del medicamento.   Si su medicamento es muy caro, por favor, pngase en contacto con nuestra oficina llamando al [phone] y presione la opcin 4 o envenos un mensaje a travs de MyChart.   No podemos decirle cul ser su copago por los medicamentos por adelantado ya que esto es diferente dependiendo de la cobertura de su seguro. Sin embargo, es posible que podamos encontrar un  medicamento sustituto a menor costo o llenar un formulario para que el seguro cubra el medicamento que se considera necesario.   Si se requiere una autorizacin previa para que su compaa de seguros cubra su medicamento, por favor permtanos de 1 a 2 das hbiles para completar este proceso.  Los precios de los medicamentos varan con frecuencia dependiendo del lugar de dnde se surte la receta y alguna farmacias pueden ofrecer precios ms baratos.  El sitio web www.goodrx.com tiene cupones para medicamentos de diferentes farmacias. Los precios aqu no tienen   en cuenta lo que podra costar con la ayuda del seguro (puede ser ms barato con su seguro), pero el sitio web puede darle el precio si no utiliz ningn seguro.  - Puede imprimir el cupn correspondiente y llevarlo con su receta a la farmacia.  - Tambin puede pasar por nuestra oficina durante el horario de atencin regular y recoger una tarjeta de cupones de GoodRx.  - Si necesita que su receta se enve electrnicamente a una farmacia diferente, informe a nuestra oficina a travs de MyChart de [place] o por telfono llamando al [phone] y presione la opcin 4.  

## 2022-07-24 ENCOUNTER — Telehealth: Payer: Self-pay

## 2022-07-24 NOTE — Telephone Encounter (Signed)
LVM for pt to see how she is doing following yesterday's appt.  Lurlean Horns., RMA

## 2022-07-30 ENCOUNTER — Ambulatory Visit (INDEPENDENT_AMBULATORY_CARE_PROVIDER_SITE_OTHER): Payer: BC Managed Care – PPO | Admitting: Dermatology

## 2022-07-30 DIAGNOSIS — L03115 Cellulitis of right lower limb: Secondary | ICD-10-CM

## 2022-07-30 DIAGNOSIS — Z86018 Personal history of other benign neoplasm: Secondary | ICD-10-CM

## 2022-07-30 MED ORDER — DOXYCYCLINE MONOHYDRATE 100 MG PO CAPS: 100.0000 mg | ORAL_CAPSULE | Freq: Two times a day (BID) | ORAL | 0 refills | Status: AC

## 2022-07-30 NOTE — Progress Notes (Signed)
   Follow-Up Visit   Subjective  Erika [name] is a 42 y.o. female who presents for the following: Suture / Staple Removal (Patient here for suture removal at right calf. Discussed bx results. Patient reports some tenderness in area. ).  The following portions of the chart were reviewed this encounter and updated as appropriate:  Tobacco  Allergies  Meds  Problems  Med Hx  Surg Hx  Fam Hx      Review of Systems: No other skin or systemic complaints except as noted in HPI or Assessment and Plan.   Objective  Well appearing patient in no apparent distress; mood and affect are within normal limits.  A focused examination was performed including right calf. Relevant physical exam findings are noted in the Assessment and Plan.  right medial calf superior Tender erythematous patch  right medial calf superior Incision site is clean, dry and intact.   Assessment & Plan  Cellulitis of right lower extremity right medial calf superior  Start doxycycline 100 mg capsule by mouth twice daily with food and drink for 7 days.   Doxycycline should be taken with food to prevent nausea. Do not lay down for 30 minutes after taking. Be cautious with sun exposure and use good sun protection while on this medication. Pregnant women should not take this medication.   Recommend leaving sutures in for the next few days.   doxycycline (MONODOX) 100 MG capsule - right medial calf superior Take 1 capsule (100 mg total) by mouth 2 (two) times daily for 7 days.  History of dysplastic nevus right medial calf superior  Discussed pathology results showing residual atypical melanocytic proliferation margins free.      Return for next monday for suture removal nurse visit.  I, Ruthell Rummage, CMA, am acting as scribe for Forest Gleason, MD.  Documentation: I have reviewed the above documentation for accuracy and completeness, and I agree with the above.  Forest Gleason, MD

## 2022-07-30 NOTE — Patient Instructions (Addendum)
Start doxycycline 100 mg capsule by mouth twice daily with food and drink for 1 week.   Doxycycline should be taken with food to prevent nausea. Do not lay down for 30 minutes after taking. Be cautious with sun exposure and use good sun protection while on this medication. Pregnant women should not take this medication.      Due to recent changes in healthcare laws, you may see results of your pathology and/or laboratory studies on MyChart before the doctors have had a chance to review them. We understand that in some cases there may be results that are confusing or concerning to you. Please understand that not all results are received at the same time and often the doctors may need to interpret multiple results in order to provide you with the best plan of care or course of treatment. Therefore, we ask that you please give Erika [name] 2 business days to thoroughly review all your results before contacting the office for clarification. Should we see a critical lab result, you will be contacted sooner.   If You Need Anything After Your Visit  If you have any questions or concerns for your doctor, please call our main line at [phone] and press option 4 to reach your doctor's medical assistant. If no one answers, please leave a voicemail as directed and we will return your call as soon as possible. Messages left after 4 pm will be answered the following business day.   You may also send Erika [name] a message via [place]. We typically respond to MyChart messages within 1-2 business days.  For prescription refills, please ask your pharmacy to contact our office. Our fax number is [phone].  If you have an urgent issue when the clinic is closed that cannot wait until the next business day, you can page your doctor at the number below.    Please note that while we do our best to be available for urgent issues outside of office hours, we are not available 24/7.   If you have an urgent issue and are unable to  reach Erika [name], you may choose to seek medical care at your doctor's office, retail clinic, urgent care center, or emergency room.  If you have a medical emergency, please immediately call 911 or go to the emergency department.  Pager Numbers  - Dr. Nehemiah Massed: [phone]  - Dr. Laurence Ferrari: [phone]  - Dr. Nicole Kindred: [phone]  In the event of inclement weather, please call our main line at [phone] for an update on the status of any delays or closures.  Dermatology Medication Tips: Please keep the boxes that topical medications come in in order to help keep track of the instructions about where and how to use these. Pharmacies typically print the medication instructions only on the boxes and not directly on the medication tubes.   If your medication is too expensive, please contact our office at [phone] option 4 or send Erika [name] a message through [place].   We are unable to tell what your co-pay for medications will be in advance as this is different depending on your insurance coverage. However, we may be able to find a substitute medication at lower cost or fill out paperwork to get insurance to cover a needed medication.   If a prior authorization is required to get your medication covered by your insurance company, please allow Erika [name] 1-2 business days to complete this process.  Drug prices often vary depending on where the prescription is filled and some pharmacies may offer cheaper  prices.  The website www.goodrx.com contains coupons for medications through different pharmacies. The prices here do not account for what the cost may be with help from insurance (it may be cheaper with your insurance), but the website can give you the price if you did not use any insurance.  - You can print the associated coupon and take it with your prescription to the pharmacy.  - You may also stop by our office during regular business hours and pick up a GoodRx coupon card.  - If you need your prescription  sent electronically to a different pharmacy, notify our office through [facility] or by phone at [phone] option 4.     Si Usted Necesita Algo Despus de Su Visita  Tambin puede enviarnos un mensaje a travs de Pharmacist, community. Por lo general respondemos a los mensajes de MyChart en el transcurso de 1 a 2 das hbiles.  Para renovar recetas, por favor pida a su farmacia que se ponga en contacto con nuestra oficina. Harland Dingwall de fax es [place] [phone].  Si tiene un asunto urgente cuando la clnica est cerrada y que no puede esperar hasta el siguiente da hbil, puede llamar/localizar a su doctor(a) al nmero que aparece a continuacin.   Por favor, tenga en cuenta que aunque hacemos todo lo posible para estar disponibles para asuntos urgentes fuera del horario de [place], no estamos disponibles las 24 horas del da, los 7 das de la [place].   Si tiene un problema urgente y no puede comunicarse con nosotros, puede optar por buscar atencin mdica  en el consultorio de su doctor(a), en una clnica privada, en un centro de atencin urgente o en una sala de emergencias.  Si tiene Engineering geologist, por favor llame inmediatamente al 911 o vaya a la sala de emergencias.  Nmeros de bper  - Dr. Nehemiah Massed: [phone]  - Dra. Moye: [phone]  - Dra. Nicole Kindred: [phone]  En caso de inclemencias del [place], por favor llame a Johnsie Kindred principal al [phone] para una actualizacin sobre el [place] de cualquier retraso o cierre.  Consejos para la medicacin en dermatologa: Por favor, guarde las cajas en las que vienen los medicamentos de uso tpico para ayudarle a seguir las instrucciones sobre dnde y cmo usarlos. Las farmacias generalmente imprimen las instrucciones del medicamento slo en las cajas y no directamente en los tubos del [place].   Si su medicamento es muy caro, por favor, pngase en contacto con Zigmund Daniel llamando al [phone] y presione  la opcin 4 o envenos un mensaje a travs de Pharmacist, community.   No podemos decirle cul ser su copago por los medicamentos por adelantado ya que esto es diferente dependiendo de la cobertura de su seguro. Sin embargo, es posible que podamos encontrar un medicamento sustituto a Electrical engineer un formulario para que el seguro cubra el medicamento que se considera necesario.   Si se requiere una autorizacin previa para que su compaa de seguros [place] su medicamento, por favor permtanos de 1 a 2 das hbiles para completar este proceso.  Los precios de los medicamentos varan con frecuencia dependiendo del Environmental consultant de dnde se surte la receta y alguna farmacias pueden ofrecer precios ms baratos.  El sitio web www.goodrx.com tiene cupones para medicamentos de Airline pilot. Los precios aqu no tienen en cuenta lo que podra costar con la ayuda del seguro (puede ser ms barato con su seguro), pero el sitio web puede darle el precio si no utiliz Research scientist (physical sciences).  -  imprimir el cupn correspondiente y llevarlo con su receta a la farmacia.  - Tambin puede pasar por nuestra oficina durante el horario de atencin regular y recoger una tarjeta de cupones de GoodRx.  - Si necesita que su receta se enve electrnicamente a una farmacia diferente, informe a nuestra oficina a travs de MyChart de [place] o por telfono llamando al [phone] y presione la opcin 4.  

## 2022-08-04 ENCOUNTER — Ambulatory Visit (INDEPENDENT_AMBULATORY_CARE_PROVIDER_SITE_OTHER): Payer: BC Managed Care – PPO

## 2022-08-04 DIAGNOSIS — Z4802 Encounter for removal of sutures: Secondary | ICD-10-CM

## 2022-08-04 MED ORDER — CEPHALEXIN 500 MG PO CAPS: 500.0000 mg | ORAL_CAPSULE | Freq: Four times a day (QID) | ORAL | 0 refills | Status: AC

## 2022-08-04 NOTE — Progress Notes (Signed)
Encounter for Removal of Sutures - Incision site at the right medial calf superior is clean, dry and intact with mild edema and erythema, photo taken.  -Dr. Laurence Ferrari reviewed photo. Patient advised to d/c doxycycline and start Cephalexin '500mg'$  q6 hours for 5 days. Patient advised she is to call after 48 hours if symptoms are worse or not improving.  - Wound cleansed, sutures removed, wound cleansed and steri strips applied.  - Patient advised to keep steri-strips dry until they fall off. - Scars remodel for a full year. - Once steri-strips fall off, patient can apply over-the-counter silicone scar cream each night to help with scar remodeling if desired. - Patient advised to call with any concerns or if they notice any new or changing lesions.   Erika [name], RMA

## 2022-08-14 ENCOUNTER — Ambulatory Visit: Payer: BC Managed Care – PPO | Admitting: Dermatology

## 2022-11-07 ENCOUNTER — Other Ambulatory Visit: Payer: Self-pay | Admitting: Obstetrics and Gynecology

## 2022-11-07 DIAGNOSIS — Z1231 Encounter for screening mammogram for malignant neoplasm of breast: Secondary | ICD-10-CM

## 2023-03-17 ENCOUNTER — Ambulatory Visit
Admission: RE | Admit: 2023-03-17 | Discharge: 2023-03-17 | Disposition: A | Discharge status: Home / Self Care | Payer: BC Managed Care – PPO | Source: Ambulatory Visit | Attending: Obstetrics and Gynecology | Admitting: Obstetrics and Gynecology

## 2023-03-17 ENCOUNTER — Encounter: Payer: Self-pay | Admitting: Radiology

## 2023-03-17 DIAGNOSIS — Z1231 Encounter for screening mammogram for malignant neoplasm of breast: Secondary | ICD-10-CM | POA: Insufficient documentation

## 2023-03-18 ENCOUNTER — Encounter: Payer: Self-pay | Admitting: Obstetrics and Gynecology

## 2023-03-20 ENCOUNTER — Other Ambulatory Visit: Payer: Self-pay | Admitting: Obstetrics and Gynecology

## 2023-03-20 DIAGNOSIS — N63 Unspecified lump in unspecified breast: Secondary | ICD-10-CM

## 2023-03-20 DIAGNOSIS — R928 Other abnormal and inconclusive findings on diagnostic imaging of breast: Secondary | ICD-10-CM

## 2023-03-20 DIAGNOSIS — R921 Mammographic calcification found on diagnostic imaging of breast: Secondary | ICD-10-CM

## 2023-03-20 DIAGNOSIS — N6489 Other specified disorders of breast: Secondary | ICD-10-CM

## 2023-03-25 ENCOUNTER — Ambulatory Visit
Admission: RE | Admit: 2023-03-25 | Discharge: 2023-03-25 | Disposition: A | Discharge status: Home / Self Care | Payer: BC Managed Care – PPO | Source: Ambulatory Visit | Attending: Obstetrics and Gynecology | Admitting: Obstetrics and Gynecology

## 2023-03-25 DIAGNOSIS — N63 Unspecified lump in unspecified breast: Secondary | ICD-10-CM | POA: Diagnosis not present

## 2023-03-25 DIAGNOSIS — R928 Other abnormal and inconclusive findings on diagnostic imaging of breast: Secondary | ICD-10-CM | POA: Diagnosis not present

## 2023-03-25 DIAGNOSIS — N6489 Other specified disorders of breast: Secondary | ICD-10-CM

## 2023-03-25 DIAGNOSIS — R921 Mammographic calcification found on diagnostic imaging of breast: Secondary | ICD-10-CM | POA: Insufficient documentation

## 2023-03-25 DIAGNOSIS — R590 Localized enlarged lymph nodes: Secondary | ICD-10-CM | POA: Diagnosis not present

## 2023-03-25 DIAGNOSIS — R920 Mammographic microcalcification found on diagnostic imaging of breast: Secondary | ICD-10-CM | POA: Diagnosis not present

## 2023-03-25 DIAGNOSIS — N6325 Unspecified lump in the left breast, overlapping quadrants: Secondary | ICD-10-CM | POA: Diagnosis not present

## 2023-03-26 ENCOUNTER — Other Ambulatory Visit: Payer: Self-pay | Admitting: Obstetrics and Gynecology

## 2023-03-26 DIAGNOSIS — R928 Other abnormal and inconclusive findings on diagnostic imaging of breast: Secondary | ICD-10-CM

## 2023-03-26 DIAGNOSIS — R921 Mammographic calcification found on diagnostic imaging of breast: Secondary | ICD-10-CM

## 2023-03-26 DIAGNOSIS — R599 Enlarged lymph nodes, unspecified: Secondary | ICD-10-CM

## 2023-03-26 DIAGNOSIS — N63 Unspecified lump in unspecified breast: Secondary | ICD-10-CM

## 2023-03-30 ENCOUNTER — Ambulatory Visit
Admission: RE | Admit: 2023-03-30 | Discharge: 2023-03-30 | Disposition: A | Discharge status: Home / Self Care | Payer: BC Managed Care – PPO | Source: Ambulatory Visit | Attending: Obstetrics and Gynecology | Admitting: Obstetrics and Gynecology

## 2023-03-30 ENCOUNTER — Other Ambulatory Visit: Payer: Self-pay | Admitting: Obstetrics and Gynecology

## 2023-03-30 DIAGNOSIS — N63 Unspecified lump in unspecified breast: Secondary | ICD-10-CM

## 2023-03-30 DIAGNOSIS — R599 Enlarged lymph nodes, unspecified: Secondary | ICD-10-CM

## 2023-03-30 DIAGNOSIS — C50812 Malignant neoplasm of overlapping sites of left female breast: Secondary | ICD-10-CM | POA: Diagnosis not present

## 2023-03-30 DIAGNOSIS — R928 Other abnormal and inconclusive findings on diagnostic imaging of breast: Secondary | ICD-10-CM

## 2023-03-30 DIAGNOSIS — N6325 Unspecified lump in the left breast, overlapping quadrants: Secondary | ICD-10-CM | POA: Insufficient documentation

## 2023-03-30 DIAGNOSIS — D0512 Intraductal carcinoma in situ of left breast: Secondary | ICD-10-CM | POA: Insufficient documentation

## 2023-03-30 DIAGNOSIS — R590 Localized enlarged lymph nodes: Secondary | ICD-10-CM | POA: Insufficient documentation

## 2023-03-30 DIAGNOSIS — R921 Mammographic calcification found on diagnostic imaging of breast: Secondary | ICD-10-CM

## 2023-03-30 DIAGNOSIS — C773 Secondary and unspecified malignant neoplasm of axilla and upper limb lymph nodes: Secondary | ICD-10-CM | POA: Diagnosis not present

## 2023-03-30 DIAGNOSIS — Z171 Estrogen receptor negative status [ER-]: Secondary | ICD-10-CM | POA: Diagnosis not present

## 2023-03-30 HISTORY — PX: BREAST BIOPSY: SHX20

## 2023-03-30 MED ORDER — LIDOCAINE 1 % OPTIME INJ - NO CHARGE
5.0000 mL | Freq: Once | INTRAMUSCULAR | Status: AC
  Administered 2023-03-30: 5 mL
  Filled 2023-03-30: qty 6

## 2023-03-30 MED ORDER — LIDOCAINE-EPINEPHRINE 1 %-1:100000 IJ SOLN
10.0000 mL | Freq: Once | INTRAMUSCULAR | Status: AC
  Administered 2023-03-30: 10 mL
  Filled 2023-03-30: qty 10

## 2023-03-30 MED ORDER — LIDOCAINE-EPINEPHRINE 1 %-1:100000 IJ SOLN
20.0000 mL | Freq: Once | INTRAMUSCULAR | Status: AC
  Administered 2023-03-30: 20 mL
  Filled 2023-03-30: qty 20

## 2023-03-31 ENCOUNTER — Encounter: Payer: Self-pay | Admitting: *Deleted

## 2023-03-31 LAB — SURGICAL PATHOLOGY

## 2023-03-31 NOTE — Progress Notes (Signed)
Received referral for newly diagnosed breast cancer from [facility] Radiology.  Navigation initiated.  Erika [name] would like to be seen at [facility].   Demographics, pathology and mammogram faxed to [facility] Breast clinic.

## 2023-04-02 ENCOUNTER — Other Ambulatory Visit: Payer: BC Managed Care – PPO

## 2023-04-07 DIAGNOSIS — C50812 Malignant neoplasm of overlapping sites of left female breast: Secondary | ICD-10-CM | POA: Diagnosis not present

## 2023-04-13 DIAGNOSIS — Z171 Estrogen receptor negative status [ER-]: Secondary | ICD-10-CM | POA: Diagnosis not present

## 2023-04-13 DIAGNOSIS — C50919 Malignant neoplasm of unspecified site of unspecified female breast: Secondary | ICD-10-CM | POA: Diagnosis not present

## 2023-04-13 DIAGNOSIS — C50812 Malignant neoplasm of overlapping sites of left female breast: Secondary | ICD-10-CM | POA: Diagnosis not present

## 2023-04-13 DIAGNOSIS — C773 Secondary and unspecified malignant neoplasm of axilla and upper limb lymph nodes: Secondary | ICD-10-CM | POA: Diagnosis not present

## 2023-04-13 DIAGNOSIS — Z9189 Other specified personal risk factors, not elsewhere classified: Secondary | ICD-10-CM | POA: Diagnosis not present

## 2023-04-13 DIAGNOSIS — C50912 Malignant neoplasm of unspecified site of left female breast: Secondary | ICD-10-CM | POA: Diagnosis not present

## 2023-04-13 DIAGNOSIS — Z803 Family history of malignant neoplasm of breast: Secondary | ICD-10-CM | POA: Diagnosis not present

## 2023-04-13 DIAGNOSIS — R590 Localized enlarged lymph nodes: Secondary | ICD-10-CM | POA: Diagnosis not present

## 2023-04-17 DIAGNOSIS — Z171 Estrogen receptor negative status [ER-]: Secondary | ICD-10-CM | POA: Diagnosis not present

## 2023-04-17 DIAGNOSIS — C50812 Malignant neoplasm of overlapping sites of left female breast: Secondary | ICD-10-CM | POA: Diagnosis not present

## 2023-04-20 DIAGNOSIS — C773 Secondary and unspecified malignant neoplasm of axilla and upper limb lymph nodes: Secondary | ICD-10-CM | POA: Diagnosis not present

## 2023-04-20 DIAGNOSIS — C50912 Malignant neoplasm of unspecified site of left female breast: Secondary | ICD-10-CM | POA: Diagnosis not present

## 2023-04-22 DIAGNOSIS — O99111 Other diseases of the blood and blood-forming organs and certain disorders involving the immune mechanism complicating pregnancy, first trimester: Secondary | ICD-10-CM | POA: Diagnosis not present

## 2023-04-22 DIAGNOSIS — Z3A01 Less than 8 weeks gestation of pregnancy: Secondary | ICD-10-CM | POA: Diagnosis not present

## 2023-04-22 DIAGNOSIS — D709 Neutropenia, unspecified: Secondary | ICD-10-CM | POA: Diagnosis not present

## 2023-04-22 DIAGNOSIS — C50912 Malignant neoplasm of unspecified site of left female breast: Secondary | ICD-10-CM | POA: Diagnosis not present

## 2023-04-22 DIAGNOSIS — O9A111 Malignant neoplasm complicating pregnancy, first trimester: Secondary | ICD-10-CM | POA: Diagnosis not present

## 2023-04-22 DIAGNOSIS — C773 Secondary and unspecified malignant neoplasm of axilla and upper limb lymph nodes: Secondary | ICD-10-CM | POA: Diagnosis not present

## 2023-04-22 DIAGNOSIS — D0512 Intraductal carcinoma in situ of left breast: Secondary | ICD-10-CM | POA: Diagnosis not present

## 2023-04-22 DIAGNOSIS — C50812 Malignant neoplasm of overlapping sites of left female breast: Secondary | ICD-10-CM | POA: Diagnosis not present

## 2023-04-29 DIAGNOSIS — Z853 Personal history of malignant neoplasm of breast: Secondary | ICD-10-CM | POA: Diagnosis not present

## 2023-04-29 DIAGNOSIS — N912 Amenorrhea, unspecified: Secondary | ICD-10-CM | POA: Diagnosis not present

## 2023-04-30 DIAGNOSIS — Z130 Encounter for screening for diseases of the blood and blood-forming organs and certain disorders involving the immune mechanism: Secondary | ICD-10-CM | POA: Diagnosis not present

## 2023-04-30 DIAGNOSIS — Z0183 Encounter for blood typing: Secondary | ICD-10-CM | POA: Diagnosis not present

## 2023-04-30 DIAGNOSIS — Z3201 Encounter for pregnancy test, result positive: Secondary | ICD-10-CM | POA: Diagnosis not present

## 2023-05-06 DIAGNOSIS — Z332 Encounter for elective termination of pregnancy: Secondary | ICD-10-CM | POA: Diagnosis not present

## 2023-05-06 DIAGNOSIS — Z30430 Encounter for insertion of intrauterine contraceptive device: Secondary | ICD-10-CM | POA: Diagnosis not present

## 2023-05-06 DIAGNOSIS — Z3A01 Less than 8 weeks gestation of pregnancy: Secondary | ICD-10-CM | POA: Diagnosis not present

## 2023-05-06 DIAGNOSIS — C50912 Malignant neoplasm of unspecified site of left female breast: Secondary | ICD-10-CM | POA: Diagnosis not present

## 2023-05-13 DIAGNOSIS — C773 Secondary and unspecified malignant neoplasm of axilla and upper limb lymph nodes: Secondary | ICD-10-CM | POA: Diagnosis not present

## 2023-05-13 DIAGNOSIS — Z171 Estrogen receptor negative status [ER-]: Secondary | ICD-10-CM | POA: Diagnosis not present

## 2023-05-13 DIAGNOSIS — Z1379 Encounter for other screening for genetic and chromosomal anomalies: Secondary | ICD-10-CM | POA: Diagnosis not present

## 2023-05-13 DIAGNOSIS — D708 Other neutropenia: Secondary | ICD-10-CM | POA: Diagnosis not present

## 2023-05-13 DIAGNOSIS — Z5112 Encounter for antineoplastic immunotherapy: Secondary | ICD-10-CM | POA: Diagnosis not present

## 2023-05-13 DIAGNOSIS — D709 Neutropenia, unspecified: Secondary | ICD-10-CM | POA: Diagnosis not present

## 2023-05-13 DIAGNOSIS — Z79899 Other long term (current) drug therapy: Secondary | ICD-10-CM | POA: Diagnosis not present

## 2023-05-13 DIAGNOSIS — C50812 Malignant neoplasm of overlapping sites of left female breast: Secondary | ICD-10-CM | POA: Diagnosis not present

## 2023-05-13 DIAGNOSIS — Z1732 Human epidermal growth factor receptor 2 negative status: Secondary | ICD-10-CM | POA: Diagnosis not present

## 2023-05-13 DIAGNOSIS — Z23 Encounter for immunization: Secondary | ICD-10-CM | POA: Diagnosis not present

## 2023-05-13 DIAGNOSIS — C50912 Malignant neoplasm of unspecified site of left female breast: Secondary | ICD-10-CM | POA: Diagnosis not present

## 2023-05-20 DIAGNOSIS — C773 Secondary and unspecified malignant neoplasm of axilla and upper limb lymph nodes: Secondary | ICD-10-CM | POA: Diagnosis not present

## 2023-05-20 DIAGNOSIS — D709 Neutropenia, unspecified: Secondary | ICD-10-CM | POA: Diagnosis not present

## 2023-05-20 DIAGNOSIS — C50912 Malignant neoplasm of unspecified site of left female breast: Secondary | ICD-10-CM | POA: Diagnosis not present

## 2023-05-20 DIAGNOSIS — D708 Other neutropenia: Secondary | ICD-10-CM | POA: Diagnosis not present

## 2023-05-27 DIAGNOSIS — D708 Other neutropenia: Secondary | ICD-10-CM | POA: Diagnosis not present

## 2023-05-27 DIAGNOSIS — C50912 Malignant neoplasm of unspecified site of left female breast: Secondary | ICD-10-CM | POA: Diagnosis not present

## 2023-05-27 DIAGNOSIS — C773 Secondary and unspecified malignant neoplasm of axilla and upper limb lymph nodes: Secondary | ICD-10-CM | POA: Diagnosis not present

## 2023-06-02 DIAGNOSIS — D709 Neutropenia, unspecified: Secondary | ICD-10-CM | POA: Diagnosis not present

## 2023-06-03 DIAGNOSIS — Z5112 Encounter for antineoplastic immunotherapy: Secondary | ICD-10-CM | POA: Diagnosis not present

## 2023-06-03 DIAGNOSIS — Z1379 Encounter for other screening for genetic and chromosomal anomalies: Secondary | ICD-10-CM | POA: Diagnosis not present

## 2023-06-03 DIAGNOSIS — Z23 Encounter for immunization: Secondary | ICD-10-CM | POA: Diagnosis not present

## 2023-06-03 DIAGNOSIS — C773 Secondary and unspecified malignant neoplasm of axilla and upper limb lymph nodes: Secondary | ICD-10-CM | POA: Diagnosis not present

## 2023-06-03 DIAGNOSIS — C50812 Malignant neoplasm of overlapping sites of left female breast: Secondary | ICD-10-CM | POA: Diagnosis not present

## 2023-06-03 DIAGNOSIS — C50912 Malignant neoplasm of unspecified site of left female breast: Secondary | ICD-10-CM | POA: Diagnosis not present

## 2023-06-03 DIAGNOSIS — Z5111 Encounter for antineoplastic chemotherapy: Secondary | ICD-10-CM | POA: Diagnosis not present

## 2023-06-03 DIAGNOSIS — Z79899 Other long term (current) drug therapy: Secondary | ICD-10-CM | POA: Diagnosis not present

## 2023-06-10 ENCOUNTER — Emergency Department
Admission: EM | Admit: 2023-06-10 | Discharge: 2023-06-10 | Discharge status: Against Medical Advice | Payer: BC Managed Care – PPO | Attending: Emergency Medicine | Admitting: Emergency Medicine

## 2023-06-10 ENCOUNTER — Other Ambulatory Visit: Payer: Self-pay

## 2023-06-10 ENCOUNTER — Encounter: Payer: Self-pay | Admitting: *Deleted

## 2023-06-10 DIAGNOSIS — Z853 Personal history of malignant neoplasm of breast: Secondary | ICD-10-CM | POA: Insufficient documentation

## 2023-06-10 DIAGNOSIS — M545 Low back pain, unspecified: Secondary | ICD-10-CM | POA: Insufficient documentation

## 2023-06-10 DIAGNOSIS — R509 Fever, unspecified: Secondary | ICD-10-CM | POA: Diagnosis not present

## 2023-06-10 DIAGNOSIS — Z5321 Procedure and treatment not carried out due to patient leaving prior to being seen by health care provider: Secondary | ICD-10-CM | POA: Diagnosis not present

## 2023-06-10 DIAGNOSIS — D708 Other neutropenia: Secondary | ICD-10-CM | POA: Diagnosis not present

## 2023-06-10 LAB — BASIC METABOLIC PANEL
Anion gap: 8 (ref 5–15)
BUN: 9 mg/dL (ref 6–20)
CO2: 24 mmol/L (ref 22–32)
Calcium: 8.5 mg/dL — ABNORMAL LOW (ref 8.9–10.3)
Chloride: 102 mmol/L (ref 98–111)
Creatinine, Ser: 0.6 mg/dL (ref 0.44–1.00)
GFR, Estimated: >60 mL/min (ref >60)
Glucose, Bld: 133 mg/dL — ABNORMAL HIGH (ref 70–99)
Potassium: 3.5 mmol/L (ref 3.5–5.1)
Sodium: 134 mmol/L — ABNORMAL LOW (ref 135–145)

## 2023-06-10 LAB — CBC
HCT: 35.8 % — ABNORMAL LOW (ref 36.0–46.0)
Hemoglobin: 12.8 g/dL (ref 12.0–15.0)
MCH: 32.6 pg (ref 26.0–34.0)
MCHC: 35.8 g/dL (ref 30.0–36.0)
MCV: 91.1 fL (ref 80.0–100.0)
Platelets: 193 10*3/uL (ref 150–400)
RBC: 3.93 MIL/uL (ref 3.87–5.11)
RDW: 11.8 % (ref 11.5–15.5)
WBC: 1.9 10*3/uL — ABNORMAL LOW (ref 4.0–10.5)
nRBC: 0 % (ref 0.0–0.2)

## 2023-06-10 NOTE — ED Triage Notes (Signed)
Pt ambulatory to triage.  Pt had chemotherapy 1 week ago.  Pt reports a fever today.  Pt has lower back pain.  Hx breast cancer.   Pt alert.

## 2023-06-14 DIAGNOSIS — D708 Other neutropenia: Secondary | ICD-10-CM | POA: Diagnosis not present

## 2023-06-16 DIAGNOSIS — Z170 Estrogen receptor positive status [ER+]: Secondary | ICD-10-CM | POA: Diagnosis not present

## 2023-06-16 DIAGNOSIS — C773 Secondary and unspecified malignant neoplasm of axilla and upper limb lymph nodes: Secondary | ICD-10-CM | POA: Diagnosis not present

## 2023-06-16 DIAGNOSIS — C50912 Malignant neoplasm of unspecified site of left female breast: Secondary | ICD-10-CM | POA: Diagnosis not present

## 2023-06-16 DIAGNOSIS — C50412 Malignant neoplasm of upper-outer quadrant of left female breast: Secondary | ICD-10-CM | POA: Diagnosis not present

## 2023-06-16 DIAGNOSIS — R92343 Mammographic extreme density, bilateral breasts: Secondary | ICD-10-CM | POA: Diagnosis not present

## 2023-06-16 DIAGNOSIS — R928 Other abnormal and inconclusive findings on diagnostic imaging of breast: Secondary | ICD-10-CM | POA: Diagnosis not present

## 2023-06-16 DIAGNOSIS — C50812 Malignant neoplasm of overlapping sites of left female breast: Secondary | ICD-10-CM | POA: Diagnosis not present

## 2023-06-18 DIAGNOSIS — C773 Secondary and unspecified malignant neoplasm of axilla and upper limb lymph nodes: Secondary | ICD-10-CM | POA: Diagnosis not present

## 2023-06-18 DIAGNOSIS — D708 Other neutropenia: Secondary | ICD-10-CM | POA: Diagnosis not present

## 2023-06-18 DIAGNOSIS — C50912 Malignant neoplasm of unspecified site of left female breast: Secondary | ICD-10-CM | POA: Diagnosis not present

## 2023-06-23 DIAGNOSIS — D708 Other neutropenia: Secondary | ICD-10-CM | POA: Diagnosis not present

## 2023-06-24 DIAGNOSIS — Z23 Encounter for immunization: Secondary | ICD-10-CM | POA: Diagnosis not present

## 2023-06-24 DIAGNOSIS — Z171 Estrogen receptor negative status [ER-]: Secondary | ICD-10-CM | POA: Diagnosis not present

## 2023-06-24 DIAGNOSIS — Z5112 Encounter for antineoplastic immunotherapy: Secondary | ICD-10-CM | POA: Diagnosis not present

## 2023-06-24 DIAGNOSIS — C50912 Malignant neoplasm of unspecified site of left female breast: Secondary | ICD-10-CM | POA: Diagnosis not present

## 2023-06-24 DIAGNOSIS — Z79899 Other long term (current) drug therapy: Secondary | ICD-10-CM | POA: Diagnosis not present

## 2023-06-24 DIAGNOSIS — C773 Secondary and unspecified malignant neoplasm of axilla and upper limb lymph nodes: Secondary | ICD-10-CM | POA: Diagnosis not present

## 2023-06-24 DIAGNOSIS — C50812 Malignant neoplasm of overlapping sites of left female breast: Secondary | ICD-10-CM | POA: Diagnosis not present

## 2023-06-24 DIAGNOSIS — D709 Neutropenia, unspecified: Secondary | ICD-10-CM | POA: Diagnosis not present

## 2023-06-24 DIAGNOSIS — Z1379 Encounter for other screening for genetic and chromosomal anomalies: Secondary | ICD-10-CM | POA: Diagnosis not present

## 2023-06-24 DIAGNOSIS — K1379 Other lesions of oral mucosa: Secondary | ICD-10-CM | POA: Diagnosis not present

## 2023-06-24 DIAGNOSIS — Z5111 Encounter for antineoplastic chemotherapy: Secondary | ICD-10-CM | POA: Diagnosis not present
